# Patient Record
Sex: Male | Born: 1985
Health system: Southern US, Community
[De-identification: ages and names within clinical notes are randomized; demographics above are authoritative.]

## PROBLEM LIST (undated history)

## (undated) DIAGNOSIS — M549 Dorsalgia, unspecified: Secondary | ICD-10-CM

---
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
## 2006-08-08 ENCOUNTER — Emergency Department (HOSPITAL_COMMUNITY): Admission: EM | Admit: 2006-08-08 | Discharge: 2006-08-08 | Payer: Self-pay | Admitting: Emergency Medicine

## 2006-08-10 ENCOUNTER — Emergency Department (HOSPITAL_COMMUNITY): Admission: EM | Admit: 2006-08-10 | Discharge: 2006-08-10 | Payer: Self-pay | Admitting: Emergency Medicine

## 2006-08-13 ENCOUNTER — Emergency Department (HOSPITAL_COMMUNITY): Admission: EM | Admit: 2006-08-13 | Discharge: 2006-08-13 | Payer: Self-pay | Admitting: Emergency Medicine

## 2006-10-20 ENCOUNTER — Emergency Department (HOSPITAL_COMMUNITY): Admission: EM | Admit: 2006-10-20 | Discharge: 2006-10-20 | Payer: Self-pay | Admitting: Emergency Medicine

## 2009-02-15 ENCOUNTER — Emergency Department (HOSPITAL_COMMUNITY): Admission: EM | Admit: 2009-02-15 | Discharge: 2009-02-15 | Payer: Self-pay | Admitting: Emergency Medicine

## 2010-10-05 ENCOUNTER — Emergency Department (HOSPITAL_COMMUNITY)
Admission: EM | Admit: 2010-10-05 | Discharge: 2010-10-05 | Disposition: A | Discharge status: Home / Self Care | Payer: Self-pay | Attending: Emergency Medicine | Admitting: Emergency Medicine

## 2010-10-05 ENCOUNTER — Emergency Department (HOSPITAL_COMMUNITY): Payer: Self-pay

## 2010-10-05 DIAGNOSIS — W19XXXA Unspecified fall, initial encounter: Secondary | ICD-10-CM | POA: Insufficient documentation

## 2010-10-05 DIAGNOSIS — Y9367 Activity, basketball: Secondary | ICD-10-CM | POA: Insufficient documentation

## 2010-10-05 DIAGNOSIS — Y9239 Other specified sports and athletic area as the place of occurrence of the external cause: Secondary | ICD-10-CM | POA: Insufficient documentation

## 2010-10-05 DIAGNOSIS — S92213A Displaced fracture of cuboid bone of unspecified foot, initial encounter for closed fracture: Secondary | ICD-10-CM | POA: Insufficient documentation

## 2011-02-20 ENCOUNTER — Emergency Department (HOSPITAL_COMMUNITY)
Admission: EM | Admit: 2011-02-20 | Discharge: 2011-02-20 | Disposition: A | Discharge status: Home / Self Care | Payer: Self-pay | Attending: Emergency Medicine | Admitting: Emergency Medicine

## 2011-02-20 DIAGNOSIS — M549 Dorsalgia, unspecified: Secondary | ICD-10-CM | POA: Insufficient documentation

## 2011-02-26 ENCOUNTER — Emergency Department (HOSPITAL_COMMUNITY)
Admission: EM | Admit: 2011-02-26 | Discharge: 2011-02-26 | Disposition: A | Discharge status: Home / Self Care | Payer: Self-pay | Attending: Emergency Medicine | Admitting: Emergency Medicine

## 2011-02-26 DIAGNOSIS — S335XXA Sprain of ligaments of lumbar spine, initial encounter: Secondary | ICD-10-CM | POA: Insufficient documentation

## 2011-02-26 DIAGNOSIS — M545 Low back pain, unspecified: Secondary | ICD-10-CM | POA: Insufficient documentation

## 2011-02-26 DIAGNOSIS — X503XXA Overexertion from repetitive movements, initial encounter: Secondary | ICD-10-CM | POA: Insufficient documentation

## 2012-12-24 ENCOUNTER — Emergency Department (HOSPITAL_COMMUNITY)
Admission: EM | Admit: 2012-12-24 | Discharge: 2012-12-25 | Disposition: A | Discharge status: Home / Self Care | Payer: Self-pay | Attending: Emergency Medicine | Admitting: Emergency Medicine

## 2012-12-24 ENCOUNTER — Encounter (HOSPITAL_COMMUNITY): Payer: Self-pay | Admitting: *Deleted

## 2012-12-24 DIAGNOSIS — R059 Cough, unspecified: Secondary | ICD-10-CM | POA: Insufficient documentation

## 2012-12-24 DIAGNOSIS — R112 Nausea with vomiting, unspecified: Secondary | ICD-10-CM

## 2012-12-24 DIAGNOSIS — R1084 Generalized abdominal pain: Secondary | ICD-10-CM | POA: Insufficient documentation

## 2012-12-24 DIAGNOSIS — R000 Tachycardia, unspecified: Secondary | ICD-10-CM | POA: Insufficient documentation

## 2012-12-24 DIAGNOSIS — R05 Cough: Secondary | ICD-10-CM | POA: Insufficient documentation

## 2012-12-24 DIAGNOSIS — D72829 Elevated white blood cell count, unspecified: Secondary | ICD-10-CM | POA: Insufficient documentation

## 2012-12-24 DIAGNOSIS — R197 Diarrhea, unspecified: Secondary | ICD-10-CM | POA: Insufficient documentation

## 2012-12-24 DIAGNOSIS — J3489 Other specified disorders of nose and nasal sinuses: Secondary | ICD-10-CM | POA: Insufficient documentation

## 2012-12-24 DIAGNOSIS — J069 Acute upper respiratory infection, unspecified: Secondary | ICD-10-CM | POA: Insufficient documentation

## 2012-12-24 DIAGNOSIS — R51 Headache: Secondary | ICD-10-CM | POA: Insufficient documentation

## 2012-12-24 DIAGNOSIS — R0982 Postnasal drip: Secondary | ICD-10-CM | POA: Insufficient documentation

## 2012-12-24 DIAGNOSIS — R1310 Dysphagia, unspecified: Secondary | ICD-10-CM | POA: Insufficient documentation

## 2012-12-24 DIAGNOSIS — R0789 Other chest pain: Secondary | ICD-10-CM | POA: Insufficient documentation

## 2012-12-24 DIAGNOSIS — J029 Acute pharyngitis, unspecified: Secondary | ICD-10-CM | POA: Insufficient documentation

## 2012-12-24 DIAGNOSIS — R0602 Shortness of breath: Secondary | ICD-10-CM | POA: Insufficient documentation

## 2012-12-24 DIAGNOSIS — F17200 Nicotine dependence, unspecified, uncomplicated: Secondary | ICD-10-CM | POA: Insufficient documentation

## 2012-12-24 MED ORDER — ALBUTEROL SULFATE (5 MG/ML) 0.5% IN NEBU
5.0000 mg | INHALATION_SOLUTION | Freq: Once | RESPIRATORY_TRACT | Status: AC
  Administered 2012-12-25: 5 mg via RESPIRATORY_TRACT
  Filled 2012-12-24: qty 1

## 2012-12-24 MED ORDER — SODIUM CHLORIDE 0.9 % IV SOLN: 1000.0000 mL | INTRAVENOUS | Status: DC

## 2012-12-24 MED ORDER — DEXAMETHASONE SODIUM PHOSPHATE 10 MG/ML IJ SOLN
10.0000 mg | Freq: Once | INTRAMUSCULAR | Status: AC
  Administered 2012-12-25: 10 mg via INTRAVENOUS
  Filled 2012-12-24: qty 1

## 2012-12-24 MED ORDER — SODIUM CHLORIDE 0.9 % IV SOLN
1000.0000 mL | INTRAVENOUS | Status: DC
  Administered 2012-12-24: 1000 mL via INTRAVENOUS

## 2012-12-24 MED ORDER — OXYCODONE HCL 5 MG/5ML PO SOLN
5.0000 mg | Freq: Once | ORAL | Status: AC
  Administered 2012-12-25: 5 mg via ORAL
  Filled 2012-12-24: qty 5

## 2012-12-24 MED ORDER — SODIUM CHLORIDE 0.9 % IV SOLN
1000.0000 mL | Freq: Once | INTRAVENOUS | Status: AC
  Administered 2012-12-25: 1000 mL via INTRAVENOUS

## 2012-12-24 MED ORDER — IBUPROFEN 100 MG/5ML PO SUSP
600.0000 mg | Freq: Once | ORAL | Status: AC
  Administered 2012-12-25: 600 mg via ORAL
  Filled 2012-12-24: qty 30

## 2012-12-24 MED ORDER — SODIUM CHLORIDE 0.9 % IV SOLN
1000.0000 mL | Freq: Once | INTRAVENOUS | Status: AC
  Administered 2012-12-24: 1000 mL via INTRAVENOUS

## 2012-12-24 MED ORDER — ONDANSETRON HCL 4 MG/2ML IJ SOLN
4.0000 mg | Freq: Once | INTRAMUSCULAR | Status: AC
  Administered 2012-12-24: 4 mg via INTRAVENOUS
  Filled 2012-12-24: qty 2

## 2012-12-24 NOTE — ED Provider Notes (Signed)
CSN: [id number]     Arrival date & time 12/24/12  2316 History     First MD Initiated Contact with Patient 12/24/12 2324     Chief Complaint  Patient presents with  . Fever  . Nausea  . Emesis   (Consider location/radiation/quality/duration/timing/severity/associated sxs/prior Treatment) Patient is a 27 y.o. male presenting with fever and vomiting. The history is provided by the patient and medical records. No language interpreter was used.  Fever Associated symptoms: chills, congestion, cough, headaches, nausea, rhinorrhea, sore throat and vomiting   Associated symptoms: no chest pain, no confusion, no diarrhea, no dysuria, no ear pain, no myalgias and no rash   Emesis Associated symptoms: abdominal pain, chills, headaches and sore throat   Associated symptoms: no arthralgias, no diarrhea and no myalgias     HUSSIEN GREENBLATT is a 27 y.o. male  with no known medical history presents to the Emergency Department complaining of gradual, persistent, progressively worsening fever, chills, headache, sore throat, nasal congestion, postnasal drip and cough beginning last night. Patient states he took Advil without relief. Patient also had associated body aches. Symptoms persisted throughout the day and began to have nausea and vomiting approximately 8 PM tonight. Patient states that both episodes were posttussive. He also states that he's had mild, generalized abdominal cramping just prior to his nausea and vomiting. He endorses several episodes of loose stools.  He reports that his abdominal pain has eased off at this time but he continues to have a sore throat.  He endorses subjective fevers at home but has not measured them. Nothing makes it better and nothing makes it worse.  Pt denies neck pain, chest pain, weakness, dizziness, syncope, dysuria, hematuria.     History reviewed. No pertinent past medical history. History reviewed. No pertinent past surgical history. History reviewed. No  pertinent family history. History  Substance Use Topics  . Smoking status: Current Every Day Smoker    Types: Cigarettes  . Smokeless tobacco: Never Used  . Alcohol Use: Yes     Comment: on occasion    Review of Systems  Constitutional: Positive for fever and chills. Negative for diaphoresis, appetite change, fatigue and unexpected weight change.  HENT: Positive for congestion, sore throat, rhinorrhea, trouble swallowing (secondary to pain), postnasal drip and sinus pressure. Negative for ear pain, mouth sores, neck pain, neck stiffness and ear discharge.   Eyes: Negative for visual disturbance.  Respiratory: Positive for cough, chest tightness, shortness of breath and wheezing. Negative for stridor.   Cardiovascular: Negative for chest pain, palpitations and leg swelling.  Gastrointestinal: Positive for nausea, vomiting and abdominal pain. Negative for diarrhea, constipation, blood in stool, abdominal distention and rectal pain.  Genitourinary: Negative for dysuria, urgency, frequency, hematuria, flank pain and difficulty urinating.  Musculoskeletal: Negative for myalgias, back pain and arthralgias.  Skin: Negative for rash.  Neurological: Positive for headaches. Negative for syncope, weakness, light-headedness and numbness.  Hematological: Negative for adenopathy.  Psychiatric/Behavioral: Negative for confusion. The patient is not nervous/anxious.   All other systems reviewed and are negative.    Allergies  Review of patient's allergies indicates no known allergies.  Home Medications  No current outpatient prescriptions on file. BP 134/75  Pulse 96  Temp(Src) 99.2 F (37.3 C) (Oral)  Resp 18  Ht 6\' 4"  (1.93 m)  Wt 225 lb (102.059 kg)  BMI 27.4 kg/m2  SpO2 98% Physical Exam  Nursing note and vitals reviewed. Constitutional: He is oriented to person, place, and time. He appears  well-developed and well-nourished. No distress.  Awake, alert, nontoxic appearance  HENT:   Head: Normocephalic and atraumatic.  Right Ear: Tympanic membrane, external ear and ear canal normal.  Left Ear: Tympanic membrane, external ear and ear canal normal.  Nose: Mucosal edema and rhinorrhea present. No epistaxis. Right sinus exhibits no maxillary sinus tenderness and no frontal sinus tenderness. Left sinus exhibits no maxillary sinus tenderness and no frontal sinus tenderness.  Mouth/Throat: Uvula is midline. Mucous membranes are not pale, dry (Mildly) and not cyanotic. No edematous. Oropharyngeal exudate, posterior oropharyngeal edema and posterior oropharyngeal erythema present. No tonsillar abscesses.  Eyes: Conjunctivae and EOM are normal. Pupils are equal, round, and reactive to light. No scleral icterus.  Neck: Normal range of motion, full passive range of motion without pain and phonation normal. Neck supple. No spinous process tenderness and no muscular tenderness present. No rigidity. Normal range of motion present.  Patent airway, handling his secretions, no stridor No nuchal rigidity To manage this  Cardiovascular: Regular rhythm, S1 normal, S2 normal, normal heart sounds and intact distal pulses.  Tachycardia present.   Pulses:      Radial pulses are 2+ on the right side, and 2+ on the left side.       Dorsalis pedis pulses are 2+ on the right side, and 2+ on the left side.       Posterior tibial pulses are 2+ on the right side, and 2+ on the left side.  Pulmonary/Chest: Effort normal. No accessory muscle usage or stridor. Not tachypneic. No respiratory distress. He has decreased breath sounds ( throughout). He has wheezes ( throughout). He has rhonchi (throughout). He has no rales. He exhibits no tenderness and no bony tenderness.  Abdominal: Soft. Bowel sounds are normal. He exhibits no mass. There is no tenderness. There is no rebound and no guarding.  Musculoskeletal: Normal range of motion. He exhibits no edema.  Lymphadenopathy:       Head (right side):  Submandibular and tonsillar adenopathy present. No submental, no preauricular, no posterior auricular and no occipital adenopathy present.       Head (left side): Submandibular and tonsillar adenopathy present. No submental, no preauricular, no posterior auricular and no occipital adenopathy present.    He has no cervical adenopathy.       Right cervical: No superficial cervical, no deep cervical and no posterior cervical adenopathy present.      Left cervical: No superficial cervical, no deep cervical and no posterior cervical adenopathy present.  Neurological: He is alert and oriented to person, place, and time. He exhibits normal muscle tone. Coordination normal.  Speech is clear and goal oriented Moves extremities without ataxia  Skin: Skin is warm and dry. No rash noted. He is not diaphoretic.  No petechiae  Psychiatric: He has a normal mood and affect.    ED Course   Procedures (including critical care time)  Labs Reviewed  RAPID STREP SCREEN - Abnormal; Notable for the following:    Streptococcus, Group A Screen (Direct) POSITIVE (*)    All other components within normal limits  CBC WITH DIFFERENTIAL - Abnormal; Notable for the following:    WBC 28.5 (*)    MCHC 36.5 (*)    Neutrophils Relative % 82 (*)    Lymphocytes Relative 4 (*)    Monocytes Relative 14 (*)    Neutro Abs 23.4 (*)    Monocytes Absolute 4.0 (*)    All other components within normal limits  COMPREHENSIVE METABOLIC PANEL -  Abnormal; Notable for the following:    Sodium 130 (*)    Glucose, Bld 123 (*)    All other components within normal limits  URINALYSIS, ROUTINE W REFLEX MICROSCOPIC - Abnormal; Notable for the following:    Hgb urine dipstick SMALL (*)    All other components within normal limits  LIPASE, BLOOD  URINE MICROSCOPIC-ADD ON  RAPID HIV SCREEN ([facility])   Dg Chest 2 View  12/25/2012   *RADIOLOGY REPORT*  Clinical Data: Shortness of breath and weakness for 24 hours.  CHEST - 2 VIEW   Comparison: None.  Findings: Normal heart size and pulmonary vascularity.  No focal airspace consolidation in the lungs.  No blunting of costophrenic angles.  No pneumothorax.  Mediastinal contours appear intact.  IMPRESSION: No evidence of active pulmonary disease.   Original Report Authenticated By: Burman Nieves, M.D.   1. Pharyngitis   2. URI (upper respiratory infection)   3. Nausea vomiting and diarrhea   4. Leukocytosis     MDM  [name] presents with subjective fever, sore throat, cough and nausea vomiting diarrhea.  Patient meets 3 of the 4 Center criteria and likely has strep throat we'll assess. Patient with decreased breath sounds wheezing and rhonchi throughout. Will obtain chest x-ray and give albuterol treatment. Will also give fluid bolus and check basic labs.    12:49 AM Pt not orthostatic.  Rapid strep positive.  CMP and lipase WNL. UA without evidence of UTI.  CXR without evidence of pulmonary edema or pneumonia.  CBC with significant leukocytosis of 28.5.  Will order rapid HIV, abd CT scan and begin IV rocephin.  Repeat fluid bolus.  Pt tolerating PO fluids without difficulty.  I personally reviewed the imaging tests through PACS system.  I reviewed available ER/hospitalization records through the EMR.     1:17 AM Alert, nontoxic, nonseptic appearing. Tolerating by mouth here in the department without difficulty. Patient states he feels much better. Reevaluation of lung found.review of treatment reveals mild persistent wheezing in the right lower lobe but clear and equal breath sounds the rest of patient's lung fields. Pt VSS.    BP 134/75  Pulse 96  Temp(Src) 99.2 F (37.3 C) (Oral)  Resp 18  Ht 6\' 4"  (1.93 m)  Wt 225 lb (102.059 kg)  BMI 27.4 kg/m2  SpO2 98%   Dr. Dierdre Highman was consulted, evaluated this patient with me and agrees with the plan.  He will follow HIV and CT abd and disposition.      Dahlia Client Chance Munter, PA-C 12/25/12 0120

## 2012-12-24 NOTE — ED Notes (Signed)
Pt c/o fever, n/v, body aches. Pt states symptoms started last night. Pt states he took pcn last night and advi.

## 2012-12-25 ENCOUNTER — Emergency Department (HOSPITAL_COMMUNITY): Payer: Self-pay

## 2012-12-25 ENCOUNTER — Encounter (HOSPITAL_COMMUNITY): Payer: Self-pay

## 2012-12-25 LAB — COMPREHENSIVE METABOLIC PANEL
ALT: 18 U/L (ref 0–53)
Alkaline Phosphatase: 77 U/L (ref 39–117)
BUN: 8 mg/dL (ref 6–23)
CO2: 23 mEq/L (ref 19–32)
Chloride: 96 mEq/L (ref 96–112)
GFR calc Af Amer: >90 mL/min (ref >90)
Glucose, Bld: 123 mg/dL — ABNORMAL HIGH (ref 70–99)
Potassium: 3.6 mEq/L (ref 3.5–5.1)
Total Bilirubin: 0.7 mg/dL (ref 0.3–1.2)

## 2012-12-25 LAB — CBC WITH DIFFERENTIAL/PLATELET
Basophils Relative: 0 % (ref 0–1)
Eosinophils Relative: 0 % (ref 0–5)
HCT: 42.7 % (ref 39.0–52.0)
Hemoglobin: 15.6 g/dL (ref 13.0–17.0)
Lymphocytes Relative: 4 % — ABNORMAL LOW (ref 12–46)
MCHC: 36.5 g/dL — ABNORMAL HIGH (ref 30.0–36.0)
Monocytes Relative: 14 % — ABNORMAL HIGH (ref 3–12)
Neutro Abs: 23.4 10*3/uL — ABNORMAL HIGH (ref 1.7–7.7)
RBC: 4.94 MIL/uL (ref 4.22–5.81)
WBC: 28.5 10*3/uL — ABNORMAL HIGH (ref 4.0–10.5)

## 2012-12-25 LAB — URINALYSIS, ROUTINE W REFLEX MICROSCOPIC
Bilirubin Urine: NEGATIVE
Ketones, ur: NEGATIVE mg/dL
Nitrite: NEGATIVE
Urobilinogen, UA: 1 mg/dL (ref 0.0–1.0)
pH: 7 (ref 5.0–8.0)

## 2012-12-25 LAB — LIPASE, BLOOD: Lipase: 27 U/L (ref 11–59)

## 2012-12-25 LAB — URINE MICROSCOPIC-ADD ON

## 2012-12-25 MED ORDER — IOHEXOL 300 MG/ML  SOLN
100.0000 mL | Freq: Once | INTRAMUSCULAR | Status: AC | PRN
  Administered 2012-12-25: 100 mL via INTRAVENOUS

## 2012-12-25 MED ORDER — IOHEXOL 300 MG/ML  SOLN
50.0000 mL | Freq: Once | INTRAMUSCULAR | Status: AC | PRN
  Administered 2012-12-25: 50 mL via ORAL

## 2012-12-25 MED ORDER — AMOXICILLIN 500 MG PO CAPS: 500.0000 mg | ORAL_CAPSULE | Freq: Three times a day (TID) | ORAL | Status: DC

## 2012-12-25 MED ORDER — ONDANSETRON HCL 4 MG PO TABS: 4.0000 mg | ORAL_TABLET | Freq: Four times a day (QID) | ORAL | Status: DC

## 2012-12-25 MED ORDER — DEXTROSE 5 % IV SOLN
1.0000 g | Freq: Once | INTRAVENOUS | Status: AC
  Administered 2012-12-25: 1 g via INTRAVENOUS
  Filled 2012-12-25: qty 10

## 2012-12-25 NOTE — ED Provider Notes (Signed)
Medical screening examination/treatment/procedure(s) were conducted as a shared visit with non-physician practitioner(s) and myself.  I personally evaluated the patient during the encounter.  Fever and sore throat with very kissing exudative tonsils on exam.  CT results shared with PT and he states he is feeling much better and wants to go home. ABx provided, plan Rx ABx, f/u in ED. All d/c and f/u instructions verbalized as understood.    [facility] referral provided for any RUQ pain or GB symptoms - no RUQ pain or tenderness - no indication for [place] at this time.   Results for orders placed during the hospital encounter of 12/24/12  RAPID STREP SCREEN      Result Value Range   Streptococcus, Group A Screen (Direct) POSITIVE (*) NEGATIVE  CBC WITH DIFFERENTIAL      Result Value Range   WBC 28.5 (*) 4.0 - 10.5 K/uL   RBC 4.94  4.22 - 5.81 MIL/uL   Hemoglobin 15.6  13.0 - 17.0 g/dL   HCT 16.1  09.6 - 04.5 %   MCV 86.4  78.0 - 100.0 fL   MCH 31.6  26.0 - 34.0 pg   MCHC 36.5 (*) 30.0 - 36.0 g/dL   RDW 40.9  81.1 - 91.4 %   Platelets 226  150 - 400 K/uL   Neutrophils Relative % 82 (*) 43 - 77 %   Lymphocytes Relative 4 (*) 12 - 46 %   Monocytes Relative 14 (*) 3 - 12 %   Eosinophils Relative 0  0 - 5 %   Basophils Relative 0  0 - 1 %   Neutro Abs 23.4 (*) 1.7 - 7.7 K/uL   Lymphs Abs 1.1  0.7 - 4.0 K/uL   Monocytes Absolute 4.0 (*) 0.1 - 1.0 K/uL   Eosinophils Absolute 0.0  0.0 - 0.7 K/uL   Basophils Absolute 0.0  0.0 - 0.1 K/uL   Smear Review MORPHOLOGY UNREMARKABLE    COMPREHENSIVE METABOLIC PANEL      Result Value Range   Sodium 130 (*) 135 - 145 mEq/L   Potassium 3.6  3.5 - 5.1 mEq/L   Chloride 96  96 - 112 mEq/L   CO2 23  19 - 32 mEq/L   Glucose, Bld 123 (*) 70 - 99 mg/dL   BUN 8  6 - 23 mg/dL   Creatinine, Ser 7.82  0.50 - 1.35 mg/dL   Calcium 9.2  8.4 - 95.6 mg/dL   Total Protein 7.3  6.0 - 8.3 g/dL   Albumin 3.7  3.5 - 5.2 g/dL   AST 18  0 - 37 U/L   ALT 18  0 - 53 U/L   Alkaline Phosphatase 77  39 - 117 U/L   Total Bilirubin 0.7  0.3 - 1.2 mg/dL   GFR calc non Af Amer >90  >90 mL/min   GFR calc Af Amer >90  >90 mL/min  LIPASE, BLOOD      Result Value Range   Lipase 27  11 - 59 U/L  URINALYSIS, ROUTINE W REFLEX MICROSCOPIC      Result Value Range   Color, Urine YELLOW  YELLOW   APPearance CLEAR  CLEAR   Specific Gravity, Urine 1.006  1.005 - 1.030   pH 7.0  5.0 - 8.0   Glucose, UA NEGATIVE  NEGATIVE mg/dL   Hgb urine dipstick SMALL (*) NEGATIVE   Bilirubin Urine NEGATIVE  NEGATIVE   Ketones, ur NEGATIVE  NEGATIVE mg/dL   Protein, ur NEGATIVE  NEGATIVE  mg/dL   Urobilinogen, UA 1.0  0.0 - 1.0 mg/dL   Nitrite NEGATIVE  NEGATIVE   Leukocytes, UA NEGATIVE  NEGATIVE  URINE MICROSCOPIC-ADD ON      Result Value Range   Squamous Epithelial / LPF RARE  RARE   RBC / HPF 0-2  <3 RBC/hpf  RAPID HIV SCREEN ([facility])      Result Value Range   SUDS Rapid HIV Screen NON REACTIVE  NON REACTIVE   Dg Chest 2 View  12/25/2012   *RADIOLOGY REPORT*  Clinical Data: Shortness of breath and weakness for 24 hours.  CHEST - 2 VIEW  Comparison: None.  Findings: Normal heart size and pulmonary vascularity.  No focal airspace consolidation in the lungs.  No blunting of costophrenic angles.  No pneumothorax.  Mediastinal contours appear intact.  IMPRESSION: No evidence of active pulmonary disease.   Original Report Authenticated By: Burman Nieves, M.D.   Ct Abdomen Pelvis W Contrast  12/25/2012   *RADIOLOGY REPORT*  Clinical Data: Abdominal pain, nausea, vomiting, diarrhea, fever, and back pain.  White cell count 28.5.  Microhematuria.  CT ABDOMEN AND PELVIS WITH CONTRAST  Technique:  Multidetector CT imaging of the abdomen and pelvis was performed following the standard protocol during bolus administration of intravenous contrast.  Contrast: OMNIPAQUE IOHEXOL 300 MG/ML  SOLN  Comparison: 02/15/2009  Findings: The lung bases are clear.  There appears to be a small stone in  the gallbladder.  No inflammatory changes.  The liver, spleen, pancreas, adrenal glands, kidneys, abdominal aorta, inferior vena cava, and retroperitoneal lymph nodes are unremarkable.  The stomach is decompressed.  Small bowel are not distended.  Stool filled colon without distension. No free air or free fluid in the abdomen.  Abdominal wall musculature appears intact.  Pelvis:  Prostate gland is not enlarged.  Bladder wall is not thickened.  No free or loculated pelvic fluid collections. Appendix is normal.  No evidence of diverticulitis.  No significant pelvic lymphadenopathy.  The normal alignment of the lumbar vertebrae.  No destructive bone lesions appreciated.  IMPRESSION: No acute process demonstrated in the abdomen or pelvis.  Probable small gallstones.   Original Report Authenticated By: Burman Nieves, M.D.      Sunnie Nielsen, MD 12/25/12 [phone]

## 2013-08-09 ENCOUNTER — Encounter (HOSPITAL_COMMUNITY): Payer: Self-pay | Admitting: Emergency Medicine

## 2013-08-09 ENCOUNTER — Emergency Department (HOSPITAL_COMMUNITY)
Admission: EM | Admit: 2013-08-09 | Discharge: 2013-08-09 | Disposition: A | Discharge status: Home / Self Care | Payer: Self-pay | Attending: Emergency Medicine | Admitting: Emergency Medicine

## 2013-08-09 DIAGNOSIS — F17200 Nicotine dependence, unspecified, uncomplicated: Secondary | ICD-10-CM | POA: Insufficient documentation

## 2013-08-09 DIAGNOSIS — M545 Low back pain, unspecified: Secondary | ICD-10-CM | POA: Insufficient documentation

## 2013-08-09 DIAGNOSIS — M549 Dorsalgia, unspecified: Secondary | ICD-10-CM

## 2013-08-09 DIAGNOSIS — G8929 Other chronic pain: Secondary | ICD-10-CM | POA: Insufficient documentation

## 2013-08-09 DIAGNOSIS — R209 Unspecified disturbances of skin sensation: Secondary | ICD-10-CM | POA: Insufficient documentation

## 2013-08-09 HISTORY — DX: Dorsalgia, unspecified: M54.9

## 2013-08-09 MED ORDER — HYDROCODONE-ACETAMINOPHEN 5-325 MG PO TABS
1.0000 | ORAL_TABLET | Freq: Four times a day (QID) | ORAL | Status: DC | PRN
Start: 2013-08-09 — End: 2015-06-03

## 2013-08-09 MED ORDER — CYCLOBENZAPRINE HCL 5 MG PO TABS: 5.0000 mg | ORAL_TABLET | Freq: Two times a day (BID) | ORAL | Status: DC | PRN

## 2013-08-09 NOTE — ED Provider Notes (Signed)
CSN: [id number]     Arrival date & time 08/09/13  1922 History  This chart was scribed for Teressa LowerVrinda Zacharee Gaddie, NP, working with Junius ArgyleForrest S Harrison, MD, by Ardelia Memsylan Malpass ED Scribe. This patient was seen in room WTR8/WTR8 and the patient's care was started at 7:48 PM.   Chief Complaint  Patient presents with  . Back Pain    The history is provided by the patient. No language interpreter was used.    HPI Comments: Joseph Simon is a 28 y.o. male who presents to the Emergency Department complaining of constant, moderate lower back pain onset 3 days ago while playing basketball. He denies any falls or other trauma occuring. He reports an associated burning sensation in his hip as well as intermittent leg paresthesias. He states that his pain is worsened with positioning. He states that he has taken OTC medications with some relief. He reports having a back injury a couple of years ago while lifting, but states that his pain improved after taking muscle relaxants. He states that he has no chronic medical conditions. He denies weakness, bowel or bladder incontinence or any other pain or symptoms.    Past Medical History  Diagnosis Date  . Back pain    History reviewed. No pertinent past surgical history. No family history on file. History  Substance Use Topics  . Smoking status: Current Every Day Smoker    Types: Cigarettes  . Smokeless tobacco: Never Used  . Alcohol Use: Yes     Comment: on occasion    Review of Systems  Gastrointestinal:       Denies bowel incontinence  Genitourinary:       Denies bladder incontinence  Musculoskeletal: Positive for back pain.  Neurological: Negative for weakness.  All other systems reviewed and are negative.   Allergies  Review of patient's allergies indicates no known allergies.  Home Medications   Current Outpatient Rx  Name  Route  Sig  Dispense  Refill  . amoxicillin (AMOXIL) 500 MG capsule   Oral   Take 1 capsule (500 mg total) by  mouth 3 (three) times daily.   21 capsule   0   . ondansetron (ZOFRAN) 4 MG tablet   Oral   Take 1 tablet (4 mg total) by mouth every 6 (six) hours.   12 tablet   0    Triage Vitals: BP 143/67  Pulse 65  Temp(Src) 98.4 F (36.9 C) (Oral)  Resp 18  SpO2 100%  Physical Exam  Nursing note and vitals reviewed. Constitutional: He is oriented to person, place, and time. He appears well-developed and well-nourished. No distress.  HENT:  Head: Normocephalic and atraumatic.  Eyes: EOM are normal.  Neck: Neck supple. No tracheal deviation present.  Cardiovascular: Normal rate.   Pulmonary/Chest: Effort normal. No respiratory distress.  Musculoskeletal: Normal range of motion. He exhibits tenderness.  Lumbar tenderness. Moving all extremites well. Equal strength in extremities  Neurological: He is alert and oriented to person, place, and time.  Skin: Skin is warm and dry.  Psychiatric: He has a normal mood and affect. His behavior is normal.    ED Course  Procedures (including critical care time)  DIAGNOSTIC STUDIES: Oxygen Saturation is 100% on RA, normal by my interpretation.    COORDINATION OF CARE: 7:52 PM- Discussed plan to treat with muscle relaxants. Will also give pt a work note.Pt advised of plan for treatment and pt agrees.  Labs Review Labs Reviewed - No data to display Imaging  Review No results found.   EKG Interpretation None      MDM   Final diagnoses:  Back pain    Pt is neurologically intact. Pt not having any red flags. Will treat symptomatically and follow up as needed  I personally performed the services described in this documentation, which was scribed in my presence. The recorded information has been reviewed and is accurate.   Teressa Lower, NP 08/09/13 [phone]

## 2013-08-09 NOTE — ED Notes (Signed)
MD at bedside. 

## 2013-08-09 NOTE — Discharge Instructions (Signed)
Back Pain, Adult Low back pain is very common. About 1 in 5 people have back pain.The cause of low back pain is rarely dangerous. The pain often gets better over time.About half of people with a sudden onset of back pain feel better in just 2 weeks. About 8 in 10 people feel better by 6 weeks.  CAUSES Some common causes of back pain include:  Strain of the muscles or ligaments supporting the spine.  Wear and tear (degeneration) of the spinal discs.  Arthritis.  Direct injury to the back. DIAGNOSIS Most of the time, the direct cause of low back pain is not known.However, back pain can be treated effectively even when the exact cause of the pain is unknown.Answering your caregiver's questions about your overall health and symptoms is one of the most accurate ways to make sure the cause of your pain is not dangerous. If your caregiver needs more information, he or she may order lab work or imaging tests (X-rays or MRIs).However, even if imaging tests show changes in your back, this usually does not require surgery. HOME CARE INSTRUCTIONS For many people, back pain returns.Since low back pain is rarely dangerous, it is often a condition that people can learn to manageon their own.   Remain active. It is stressful on the back to sit or stand in one place. Do not sit, drive, or stand in one place for more than 30 minutes at a time. Take short walks on level surfaces as soon as pain allows.Try to increase the length of time you walk each day.  Do not stay in bed.Resting more than 1 or 2 days can delay your recovery.  Do not avoid exercise or work.Your body is made to move.It is not dangerous to be active, even though your back may hurt.Your back will likely heal faster if you return to being active before your pain is gone.  Pay attention to your body when you bend and lift. Many people have less discomfortwhen lifting if they bend their knees, keep the load close to their bodies,and  avoid twisting. Often, the most comfortable positions are those that put less stress on your recovering back.  Find a comfortable position to sleep. Use a firm mattress and lie on your side with your knees slightly bent. If you lie on your back, put a pillow under your knees.  Only take over-the-counter or prescription medicines as directed by your caregiver. Over-the-counter medicines to reduce pain and inflammation are often the most helpful.Your caregiver may prescribe muscle relaxant drugs.These medicines help dull your pain so you can more quickly return to your normal activities and healthy exercise.  Put ice on the injured area.  Put ice in a plastic bag.  Place a towel between your skin and the bag.  Leave the ice on for 15-20 minutes, 03-04 times a day for the first 2 to 3 days. After that, ice and heat may be alternated to reduce pain and spasms.  Ask your caregiver about trying back exercises and gentle massage. This may be of some benefit.  Avoid feeling anxious or stressed.Stress increases muscle tension and can worsen back pain.It is important to recognize when you are anxious or stressed and learn ways to manage it.Exercise is a great option. SEEK MEDICAL CARE IF:  You have pain that is not relieved with rest or medicine.  You have pain that does not improve in 1 week.  You have new symptoms.  You are generally not feeling well. SEEK   IMMEDIATE MEDICAL CARE IF:   You have pain that radiates from your back into your legs.  You develop new bowel or bladder control problems.  You have unusual weakness or numbness in your arms or legs.  You develop nausea or vomiting.  You develop abdominal pain.  You feel faint. Document Released: 04/23/2005 Document Revised: 10/23/2011 Document Reviewed: 09/11/2010 [employer] Patient Information 2014 [employer].  

## 2013-08-09 NOTE — ED Notes (Addendum)
Pt presents with NAD. Chronic back pain- Pt reports playing basketball 3 days ago and has a flare up. Denies numbness and tingling. Pt reports OTC meds with some relief

## 2013-08-10 NOTE — ED Provider Notes (Signed)
Medical screening examination/treatment/procedure(s) were performed by non-physician practitioner and as supervising physician I was immediately available for consultation/collaboration.   EKG Interpretation None        Junius ArgyleForrest S Markeisha Mancias, MD 08/10/13 1134

## 2013-09-04 ENCOUNTER — Encounter (HOSPITAL_BASED_OUTPATIENT_CLINIC_OR_DEPARTMENT_OTHER): Payer: Self-pay | Admitting: Emergency Medicine

## 2013-09-04 ENCOUNTER — Emergency Department (HOSPITAL_BASED_OUTPATIENT_CLINIC_OR_DEPARTMENT_OTHER): Payer: Self-pay

## 2013-09-04 ENCOUNTER — Emergency Department (HOSPITAL_BASED_OUTPATIENT_CLINIC_OR_DEPARTMENT_OTHER)
Admission: EM | Admit: 2013-09-04 | Discharge: 2013-09-04 | Disposition: A | Discharge status: Home / Self Care | Payer: Self-pay | Attending: Emergency Medicine | Admitting: Emergency Medicine

## 2013-09-04 DIAGNOSIS — M545 Low back pain, unspecified: Secondary | ICD-10-CM | POA: Insufficient documentation

## 2013-09-04 DIAGNOSIS — F17200 Nicotine dependence, unspecified, uncomplicated: Secondary | ICD-10-CM | POA: Insufficient documentation

## 2013-09-04 DIAGNOSIS — M549 Dorsalgia, unspecified: Secondary | ICD-10-CM

## 2013-09-04 MED ORDER — PREDNISONE 10 MG PO TABS: ORAL_TABLET | ORAL | Status: DC

## 2013-09-04 MED ORDER — TRAMADOL HCL 50 MG PO TABS
50.0000 mg | ORAL_TABLET | Freq: Four times a day (QID) | ORAL | Status: DC | PRN
Start: 2013-09-04 — End: 2015-04-01

## 2013-09-04 NOTE — ED Provider Notes (Signed)
Medical screening examination/treatment/procedure(s) were performed by non-physician practitioner and as supervising physician I was immediately available for consultation/collaboration.   EKG Interpretation None        David H Yao, MD 09/04/13 2123 

## 2013-09-04 NOTE — ED Provider Notes (Signed)
CSN: [id number]     Arrival date & time 09/04/13  1506 History   First MD Initiated Contact with Patient 09/04/13 1516     Chief Complaint  Patient presents with  . Back Pain     (Consider location/radiation/quality/duration/timing/severity/associated sxs/prior Treatment) HPI Comments: Pt states that he has been having intermittent lower back pain for the last 6 months. Denies numbness, weakness, or incontinence. Pt states that medications help but only short term. Denies any known falls or injuries. Has some radiation to right leg.  The history is provided by the patient. No language interpreter was used.    Past Medical History  Diagnosis Date  . Back pain    History reviewed. No pertinent past surgical history. No family history on file. History  Substance Use Topics  . Smoking status: Current Every Day Smoker    Types: Cigarettes  . Smokeless tobacco: Never Used  . Alcohol Use: Yes     Comment: on occasion    Review of Systems  Constitutional: Negative.   Respiratory: Negative.   Cardiovascular: Negative.       Allergies  Review of patient's allergies indicates no known allergies.  Home Medications   Prior to Admission medications   Medication Sig Start Date End Date Taking? Authorizing Provider  acetaminophen (TYLENOL) 500 MG tablet Take 500 mg by mouth every 6 (six) hours as needed for mild pain.    Historical Provider, MD  cyclobenzaprine (FLEXERIL) 5 MG tablet Take 1 tablet (5 mg total) by mouth 2 (two) times daily as needed for muscle spasms. 08/09/13   Teressa LowerVrinda Kellyn Mccary, NP  HYDROcodone-acetaminophen (NORCO/VICODIN) 5-325 MG per tablet Take 1-2 tablets by mouth every 6 (six) hours as needed. 08/09/13   Teressa LowerVrinda Carrina Schoenberger, NP  ibuprofen (ADVIL,MOTRIN) 200 MG tablet Take 200 mg by mouth every 6 (six) hours as needed for mild pain.    Historical Provider, MD   BP 160/98  Pulse 79  Temp(Src) 98.3 F (36.8 C) (Oral)  Resp 18  Ht 6\' 4"  (1.93 m)  Wt 225 lb (102.059  kg)  BMI 27.40 kg/m2  SpO2 100% Physical Exam  Nursing note and vitals reviewed. Constitutional: He is oriented to person, place, and time. He appears well-developed and well-nourished.  Cardiovascular: Normal rate and regular rhythm.   Pulmonary/Chest: Effort normal and breath sounds normal.  Musculoskeletal: Normal range of motion.  Lower lumbar tenderness with palpation  Neurological: He is alert and oriented to person, place, and time. Coordination normal.  Skin: Skin is warm and dry.  Psychiatric: He has a normal mood and affect.    ED Course  Procedures (including critical care time) Labs Review Labs Reviewed - No data to display  Imaging Review Dg Lumbar Spine Complete  09/04/2013   CLINICAL DATA:  Back pain.  EXAM: LUMBAR SPINE - COMPLETE 4+ VIEW  COMPARISON:  None.  FINDINGS: There is no evidence of lumbar spine fracture. Alignment is normal. Intervertebral disc spaces are maintained.  IMPRESSION: Negative.   Electronically Signed   By: Maisie Fushomas  Register   On: 09/04/2013 16:14     EKG Interpretation None      MDM   Final diagnoses:  Back pain    Has no red flags. No bony abnormalities or lesions noted. Will treat with prednisone and tramadol and have follow up with Dr. Pearletha Forgehudnall for continued symptoms   Teressa LowerVrinda Meylin Stenzel, NP 09/04/13 1623

## 2013-09-04 NOTE — ED Notes (Signed)
Pt c/o lower back pain that has been intermittent for 6 months but became constant and more painful x2 days. Pt reports Advil helps but pain returns when med wears off.

## 2013-09-04 NOTE — Discharge Instructions (Signed)
Follow up as discussed for continued or worsening symptoms. Your x-ray was unremarkable today. Back Pain, Adult Low back pain is very common. About 1 in 5 people have back pain.The cause of low back pain is rarely dangerous. The pain often gets better over time.About half of people with a sudden onset of back pain feel better in just 2 weeks. About 8 in 10 people feel better by 6 weeks.  CAUSES Some common causes of back pain include:  Strain of the muscles or ligaments supporting the spine.  Wear and tear (degeneration) of the spinal discs.  Arthritis.  Direct injury to the back. DIAGNOSIS Most of the time, the direct cause of low back pain is not known.However, back pain can be treated effectively even when the exact cause of the pain is unknown.Answering your caregiver's questions about your overall health and symptoms is one of the most accurate ways to make sure the cause of your pain is not dangerous. If your caregiver needs more information, he or she may order lab work or imaging tests (X-rays or MRIs).However, even if imaging tests show changes in your back, this usually does not require surgery. HOME CARE INSTRUCTIONS For many people, back pain returns.Since low back pain is rarely dangerous, it is often a condition that people can learn to [facility] their own.   Remain active. It is stressful on the back to sit or stand in one place. Do not sit, drive, or stand in one place for more than 30 minutes at a time. Take short walks on level surfaces as soon as pain allows.Try to increase the length of time you walk each day.  Do not stay in bed.Resting more than 1 or 2 days can delay your recovery.  Do not avoid exercise or work.Your body is made to move.It is not dangerous to be active, even though your back may hurt.Your back will likely heal faster if you return to being active before your pain is gone.  Pay attention to your body when you bend and lift. Many people have  less discomfortwhen lifting if they bend their knees, keep the load close to their bodies,and avoid twisting. Often, the most comfortable positions are those that put less stress on your recovering back.  Find a comfortable position to sleep. Use a firm mattress and lie on your side with your knees slightly bent. If you lie on your back, put a pillow under your knees.  Only take over-the-counter or prescription medicines as directed by your caregiver. Over-the-counter medicines to reduce pain and inflammation are often the most helpful.Your caregiver may prescribe muscle relaxant drugs.These medicines help dull your pain so you can more quickly return to your normal activities and healthy exercise.  Put ice on the injured area.  Put ice in a plastic bag.  Place a towel between your skin and the bag.  Leave the ice on for 15-20 minutes, 03-04 times a day for the first 2 to 3 days. After that, ice and heat may be alternated to reduce pain and spasms.  Ask your caregiver about trying back exercises and gentle massage. This may be of some benefit.  Avoid feeling anxious or stressed.Stress increases muscle tension and can worsen back pain.It is important to recognize when you are anxious or stressed and learn ways to manage it.Exercise is a great option. SEEK MEDICAL CARE IF:  You have pain that is not relieved with rest or medicine.  You have pain that does not improve in 1  week.  You have new symptoms.  You are generally not feeling well. SEEK IMMEDIATE MEDICAL CARE IF:   You have pain that radiates from your back into your legs.  You develop new bowel or bladder control problems.  You have unusual weakness or numbness in your arms or legs.  You develop nausea or vomiting.  You develop abdominal pain.  You feel faint. Document Released: 04/23/2005 Document Revised: 10/23/2011 Document Reviewed: 09/11/2010 [facility] Patient Information 2014 [place], [place].

## 2013-09-07 ENCOUNTER — Encounter (HOSPITAL_BASED_OUTPATIENT_CLINIC_OR_DEPARTMENT_OTHER): Payer: Self-pay | Admitting: Emergency Medicine

## 2013-09-07 ENCOUNTER — Emergency Department (HOSPITAL_BASED_OUTPATIENT_CLINIC_OR_DEPARTMENT_OTHER)
Admission: EM | Admit: 2013-09-07 | Discharge: 2013-09-07 | Disposition: A | Discharge status: Home / Self Care | Payer: Self-pay | Attending: Emergency Medicine | Admitting: Emergency Medicine

## 2013-09-07 DIAGNOSIS — M545 Low back pain, unspecified: Secondary | ICD-10-CM | POA: Insufficient documentation

## 2013-09-07 DIAGNOSIS — M549 Dorsalgia, unspecified: Secondary | ICD-10-CM

## 2013-09-07 DIAGNOSIS — F17200 Nicotine dependence, unspecified, uncomplicated: Secondary | ICD-10-CM | POA: Insufficient documentation

## 2013-09-07 MED ORDER — CYCLOBENZAPRINE HCL 5 MG PO TABS: 5.0000 mg | ORAL_TABLET | Freq: Two times a day (BID) | ORAL | Status: DC | PRN

## 2013-09-07 MED ORDER — KETOROLAC TROMETHAMINE 30 MG/ML IJ SOLN
60.0000 mg | Freq: Once | INTRAMUSCULAR | Status: AC
  Administered 2013-09-07: 60 mg via INTRAMUSCULAR
  Filled 2013-09-07: qty 2

## 2013-09-07 NOTE — Discharge Instructions (Signed)
Back Pain, Adult  Back pain is very common. The pain often gets better over time. The cause of back pain is usually not dangerous. Most people can learn to manage their back pain on their own.   HOME CARE   · Stay active. Start with short walks on flat ground if you can. Try to walk farther each day.  · Do not sit, drive, or stand in one place for more than 30 minutes. Do not stay in bed.  · Do not avoid exercise or work. Activity can help your back heal faster.  · Be careful when you bend or lift an object. Bend at your knees, keep the object close to you, and do not twist.  · Sleep on a firm mattress. Lie on your side, and bend your knees. If you lie on your back, put a pillow under your knees.  · Only take medicines as told by your doctor.  · Put ice on the injured area.  · Put ice in a plastic bag.  · Place a towel between your skin and the bag.  · Leave the ice on for 15-20 minutes, 03-04 times a day for the first 2 to 3 days. After that, you can switch between ice and heat packs.  · Ask your doctor about back exercises or massage.  · Avoid feeling anxious or stressed. Find good ways to deal with stress, such as exercise.  GET HELP RIGHT AWAY IF:   · Your pain does not go away with rest or medicine.  · Your pain does not go away in 1 week.  · You have new problems.  · You do not feel well.  · The pain spreads into your legs.  · You cannot control when you poop (bowel movement) or pee (urinate).  · Your arms or legs feel weak or lose feeling (numbness).  · You feel sick to your stomach (nauseous) or throw up (vomit).  · You have belly (abdominal) pain.  · You feel like you may pass out (faint).  MAKE SURE YOU:   · Understand these instructions.  · Will watch your condition.  · Will get help right away if you are not doing well or get worse.  Document Released: 10/10/2007 Document Revised: 07/16/2011 Document Reviewed: 09/11/2010  [employer]® Patient Information ©2014 [employer].

## 2013-09-07 NOTE — ED Notes (Signed)
Additional pillow provided for comfort per request.

## 2013-09-07 NOTE — ED Provider Notes (Signed)
CSN: [id number]     Arrival date & time 09/07/13  1204 History   First MD Initiated Contact with Patient 09/07/13 1207     Chief Complaint  Patient presents with  . Back Pain     (Consider location/radiation/quality/duration/timing/severity/associated sxs/prior Treatment) HPI Comments: Pt states that he has been having pain for the last six months and it seems to progressively getting worse. Pt denies numbness, weakness or incontinence. Pt states that he has not had injury to the area. Has tried prescription medications which help intermittently but the pain is radiating down his legs bilaterally. Pt states that he was seen in the er 3 days ago and given prednisone and ultram without relief. Pt states that he can't see a specialist because he doesn't have insurance and can't afford it  The history is provided by the patient. No language interpreter was used.    Past Medical History  Diagnosis Date  . Back pain    History reviewed. No pertinent past surgical history. No family history on file. History  Substance Use Topics  . Smoking status: Current Every Day Smoker    Types: Cigarettes  . Smokeless tobacco: Never Used  . Alcohol Use: Yes     Comment: on occasion    Review of Systems  Constitutional: Negative.  Negative for fever.  Respiratory: Negative.   Cardiovascular: Negative.       Allergies  Review of patient's allergies indicates no known allergies.  Home Medications   Prior to Admission medications   Medication Sig Start Date End Date Taking? Authorizing Provider  acetaminophen (TYLENOL) 500 MG tablet Take 500 mg by mouth every 6 (six) hours as needed for mild pain.    Historical Provider, MD  cyclobenzaprine (FLEXERIL) 5 MG tablet Take 1 tablet (5 mg total) by mouth 2 (two) times daily as needed for muscle spasms. 08/09/13   Teressa LowerVrinda Deondra Wigger, NP  HYDROcodone-acetaminophen (NORCO/VICODIN) 5-325 MG per tablet Take 1-2 tablets by mouth every 6 (six) hours as needed.  08/09/13   Teressa LowerVrinda Bertis Hustead, NP  ibuprofen (ADVIL,MOTRIN) 200 MG tablet Take 200 mg by mouth every 6 (six) hours as needed for mild pain.    Historical Provider, MD  predniSONE (DELTASONE) 10 MG tablet 6 day step down dose 09/04/13   Teressa LowerVrinda Denny Lave, NP  traMADol (ULTRAM) 50 MG tablet Take 1 tablet (50 mg total) by mouth every 6 (six) hours as needed. 09/04/13   Teressa LowerVrinda Deania Siguenza, NP   BP 163/87  Pulse 77  Temp(Src) 98.6 F (37 C)  Resp 20  Ht 6\' 5"  (1.956 m)  Wt 230 lb (104.327 kg)  BMI 27.27 kg/m2  SpO2 100% Physical Exam  Nursing note and vitals reviewed. Constitutional: He is oriented to person, place, and time. He appears well-developed and well-nourished.  Cardiovascular: Normal rate and regular rhythm.   Pulmonary/Chest: Effort normal and breath sounds normal.  Abdominal: Bowel sounds are normal.  Musculoskeletal: Normal range of motion.  Lumbar spine and paraspinal tenderness. Pt is moving all extremities without any problem  Neurological: He is alert and oriented to person, place, and time. Coordination normal.  Skin: Skin is warm and dry.  Psychiatric: He has a normal mood and affect.    ED Course  Procedures (including critical care time) Labs Review Labs Reviewed - No data to display  Imaging Review No results found.   EKG Interpretation None      MDM   Final diagnoses:  Back pain    Pt not having any deficits.  No fever. No history of drug use. No red flags.     Teressa LowerVrinda Rose Hegner, NP 09/07/13 1708

## 2013-09-07 NOTE — ED Notes (Signed)
Pt reports lower lumbar pain radiating down to bilateral legs, denies uncontrolled loss of bowel or bladder, tingling to legs reported.

## 2013-09-08 NOTE — ED Provider Notes (Signed)
Medical screening examination/treatment/procedure(s) were performed by non-physician practitioner and as supervising physician I was immediately available for consultation/collaboration.     Cully Luckow, MD 09/08/13 0739 

## 2014-08-10 ENCOUNTER — Encounter (HOSPITAL_BASED_OUTPATIENT_CLINIC_OR_DEPARTMENT_OTHER): Payer: Self-pay | Admitting: Emergency Medicine

## 2014-08-10 ENCOUNTER — Emergency Department (HOSPITAL_BASED_OUTPATIENT_CLINIC_OR_DEPARTMENT_OTHER): Payer: Self-pay

## 2014-08-10 ENCOUNTER — Emergency Department (HOSPITAL_BASED_OUTPATIENT_CLINIC_OR_DEPARTMENT_OTHER)
Admission: EM | Admit: 2014-08-10 | Discharge: 2014-08-10 | Disposition: A | Discharge status: Home / Self Care | Payer: Self-pay | Attending: Emergency Medicine | Admitting: Emergency Medicine

## 2014-08-10 DIAGNOSIS — Z7982 Long term (current) use of aspirin: Secondary | ICD-10-CM | POA: Insufficient documentation

## 2014-08-10 DIAGNOSIS — Z79899 Other long term (current) drug therapy: Secondary | ICD-10-CM | POA: Insufficient documentation

## 2014-08-10 DIAGNOSIS — Z720 Tobacco use: Secondary | ICD-10-CM | POA: Insufficient documentation

## 2014-08-10 DIAGNOSIS — J069 Acute upper respiratory infection, unspecified: Secondary | ICD-10-CM | POA: Insufficient documentation

## 2014-08-10 MED ORDER — GUAIFENESIN 100 MG/5ML PO LIQD: 100.0000 mg | ORAL | Status: DC | PRN

## 2014-08-10 NOTE — ED Provider Notes (Signed)
CSN: [id number]     Arrival date & time 08/10/14  1906 History   First MD Initiated Contact with Patient 08/10/14 2022     Chief Complaint  Patient presents with  . Back Pain  . Chills  . Cough     (Consider location/radiation/quality/duration/timing/severity/associated sxs/prior Treatment) HPI   29 year old male who presents with URI symptoms. For the past 2 days patient has had a constellation of symptoms including headache, nasal congestion, sneezing, coughing, sore throat, runny nose, sinus pain. Yesterday he endorsed chills and tactile fever. Denies any nausea vomiting but endorsed some mild loose stools. No specific treatment tried. Furthermore patient mentioned that he would like to be evaluated for his gallstones. States that he has low back pain and has had x-ray of his low back from previous ER visit and was told that he has gallstones. He denies having any right upper quadrant abdominal pain, denies postprandial pain, nausea or vomiting. He is not a diabetic and denies any history of alcohol abuse. Last normal bowel movement aside from some loose stools. He has no other complaint.    Past Medical History  Diagnosis Date  . Back pain    History reviewed. No pertinent past surgical history. History reviewed. No pertinent family history. History  Substance Use Topics  . Smoking status: Current Every Day Smoker    Types: Cigarettes  . Smokeless tobacco: Never Used  . Alcohol Use: Yes     Comment: on occasion    Review of Systems  All other systems reviewed and are negative.     Allergies  Review of patient's allergies indicates no known allergies.  Home Medications   Prior to Admission medications   Medication Sig Start Date End Date Taking? Authorizing Provider  acetaminophen (TYLENOL) 500 MG tablet Take 500 mg by mouth every 6 (six) hours as needed for mild pain.    Historical Provider, MD  cyclobenzaprine (FLEXERIL) 5 MG tablet Take 1 tablet (5 mg total) by  mouth 2 (two) times daily as needed for muscle spasms. 08/09/13   Teressa LowerVrinda Pickering, NP  cyclobenzaprine (FLEXERIL) 5 MG tablet Take 1 tablet (5 mg total) by mouth 2 (two) times daily as needed for muscle spasms. 09/07/13   Teressa LowerVrinda Pickering, NP  HYDROcodone-acetaminophen (NORCO/VICODIN) 5-325 MG per tablet Take 1-2 tablets by mouth every 6 (six) hours as needed. 08/09/13   Teressa LowerVrinda Pickering, NP  ibuprofen (ADVIL,MOTRIN) 200 MG tablet Take 200 mg by mouth every 6 (six) hours as needed for mild pain.    Historical Provider, MD  predniSONE (DELTASONE) 10 MG tablet 6 day step down dose 09/04/13   Teressa LowerVrinda Pickering, NP  traMADol (ULTRAM) 50 MG tablet Take 1 tablet (50 mg total) by mouth every 6 (six) hours as needed. 09/04/13   Teressa LowerVrinda Pickering, NP   BP 154/95 mmHg  Pulse 70  Temp(Src) 98.8 F (37.1 C) (Oral)  Resp 20  Ht 6\' 4"  (1.93 m)  Wt 225 lb (102.059 kg)  BMI 27.40 kg/m2  SpO2 99% Physical Exam  Constitutional: He is oriented to person, place, and time. He appears well-developed and well-nourished. No distress.  HENT:  Head: Atraumatic.  Right Ear: External ear normal.  Left Ear: External ear normal.  Nose: Nose normal.  Mouth/Throat: Oropharynx is clear and moist. No oropharyngeal exudate.  Eyes: Conjunctivae are normal.  Neck: Normal range of motion. Neck supple.  No nuchal rigidity  Cardiovascular: Normal rate and regular rhythm.   Pulmonary/Chest: Effort normal and breath sounds normal.  Abdominal:  Soft. There is no tenderness.  Musculoskeletal: He exhibits no edema.  Neurological: He is alert and oriented to person, place, and time.  Skin: No rash noted.  Psychiatric: He has a normal mood and affect.  Nursing note and vitals reviewed.   ED Course  Procedures (including critical care time)  Patient here with URI symptoms. Chest x-ray shows no signs of pneumonia. Reassurance given. Patient also reports that he has low back pain that is persistent and the last time he was here he had a  x-ray of his back that indicates "gallstone" so therefore he request for further management of his gallstones. Patient however does not have a right upper quadrant abdominal tenderness, no symptoms to suggest gallstones. I reassured patient that it is unlikely that he has gallstones. Recommend symptomatically treatment and return precaution given. Patient voiced understanding and agrees with plan.  Labs Review Labs Reviewed - No data to display  Imaging Review Dg Chest 2 View  08/10/2014   CLINICAL DATA:  Three-day history of cough and chills  EXAM: CHEST  2 VIEW  COMPARISON:  December 25, 2012  FINDINGS: Lungs are clear. Heart size and pulmonary vascularity are normal. No adenopathy. No bone lesions.  IMPRESSION: No edema or consolidation.   Electronically Signed   By: Bretta Bang III M.D.   On: 08/10/2014 20:50     EKG Interpretation None      MDM   Final diagnoses:  URI (upper respiratory infection)    BP 157/80 mmHg  Pulse 64  Temp(Src) 98.8 F (37.1 C) (Oral)  Resp 16  Ht  (1.93 m)  Wt 225 lb (102.059 kg)  BMI 27.40 kg/m2  SpO2 100%     Fayrene Helper, PA-C 08/10/14 2250  Rolan Bucco, MD 08/10/14 2353

## 2014-08-10 NOTE — ED Notes (Signed)
Patient states that he is having cough and cold type symptoms for 2 days. Also states that he is having lower "back pain, not sure if it is due to the gallstone that I have" then points to his left hip region

## 2014-08-10 NOTE — Discharge Instructions (Signed)
Upper Respiratory Infection, Adult An upper respiratory infection (URI) is also sometimes known as the common cold. The upper respiratory tract includes the nose, sinuses, throat, trachea, and bronchi. Bronchi are the airways leading to the lungs. Most people improve within 1 week, but symptoms can last up to 2 weeks. A residual cough may last even longer.  CAUSES Many different viruses can infect the tissues lining the upper respiratory tract. The tissues become irritated and inflamed and often become very moist. Mucus production is also common. A cold is contagious. You can easily spread the virus to others by oral contact. This includes kissing, sharing a glass, coughing, or sneezing. Touching your mouth or nose and then touching a surface, which is then touched by another person, can also spread the virus. SYMPTOMS  Symptoms typically develop 1 to 3 days after you come in contact with a cold virus. Symptoms vary from person to person. They may include:  Runny nose.  Sneezing.  Nasal congestion.  Sinus irritation.  Sore throat.  Loss of voice (laryngitis).  Cough.  Fatigue.  Muscle aches.  Loss of appetite.  Headache.  Low-grade fever. DIAGNOSIS  You might diagnose your own cold based on familiar symptoms, since most people get a cold 2 to 3 times a year. Your caregiver can confirm this based on your exam. Most importantly, your caregiver can check that your symptoms are not due to another disease such as strep throat, sinusitis, pneumonia, asthma, or epiglottitis. Blood tests, throat tests, and X-rays are not necessary to diagnose a common cold, but they may sometimes be helpful in excluding other more serious diseases. Your caregiver will decide if any further tests are required. RISKS AND COMPLICATIONS  You may be at risk for a more severe case of the common cold if you smoke cigarettes, have chronic heart disease (such as heart failure) or lung disease (such as asthma), or if  you have a weakened immune system. The very young and very old are also at risk for more serious infections. Bacterial sinusitis, middle ear infections, and bacterial pneumonia can complicate the common cold. The common cold can worsen asthma and chronic obstructive pulmonary disease (COPD). Sometimes, these complications can require emergency medical care and may be life-threatening. PREVENTION  The best way to protect against getting a cold is to practice good hygiene. Avoid oral or hand contact with people with cold symptoms. Wash your hands often if contact occurs. There is no clear evidence that vitamin C, vitamin E, echinacea, or exercise reduces the chance of developing a cold. However, it is always recommended to get plenty of rest and practice good nutrition. TREATMENT  Treatment is directed at relieving symptoms. There is no cure. Antibiotics are not effective, because the infection is caused by a virus, not by bacteria. Treatment may include:  Increased fluid intake. Sports drinks offer valuable electrolytes, sugars, and fluids.  Breathing heated mist or steam (vaporizer or shower).  Eating chicken soup or other clear broths, and maintaining good nutrition.  Getting plenty of rest.  Using gargles or lozenges for comfort.  Controlling fevers with ibuprofen or acetaminophen as directed by your caregiver.  Increasing usage of your inhaler if you have asthma. Zinc gel and zinc lozenges, taken in the first 24 hours of the common cold, can shorten the duration and lessen the severity of symptoms. Pain medicines may help with fever, muscle aches, and throat pain. A variety of non-prescription medicines are available to treat congestion and runny nose. Your caregiver   can make recommendations and may suggest nasal or lung inhalers for other symptoms.  HOME CARE INSTRUCTIONS   Only take over-the-counter or prescription medicines for pain, discomfort, or fever as directed by your  caregiver.  Use a warm mist humidifier or inhale steam from a shower to increase air moisture. This may keep secretions moist and make it easier to breathe.  Drink enough water and fluids to keep your urine clear or pale yellow.  Rest as needed.  Return to work when your temperature has returned to normal or as your caregiver advises. You may need to stay home longer to avoid infecting others. You can also use a face mask and careful hand washing to prevent spread of the virus. SEEK MEDICAL CARE IF:   After the first few days, you feel you are getting worse rather than better.  You need your caregiver's advice about medicines to control symptoms.  You develop chills, worsening shortness of breath, or brown or red sputum. These may be signs of pneumonia.  You develop yellow or brown nasal discharge or pain in the face, especially when you bend forward. These may be signs of sinusitis.  You develop a fever, swollen neck glands, pain with swallowing, or white areas in the back of your throat. These may be signs of strep throat. SEEK IMMEDIATE MEDICAL CARE IF:   You have a fever.  You develop severe or persistent headache, ear pain, sinus pain, or chest pain.  You develop wheezing, a prolonged cough, cough up blood, or have a change in your usual mucus (if you have chronic lung disease).  You develop sore muscles or a stiff neck. Document Released: 10/17/2000 Document Revised: 07/16/2011 Document Reviewed: 07/29/2013 [employer] Patient Information 2015 [employer]. This information is not intended to replace advice given to you by your health care provider. Make sure you discuss any questions you have with your health care provider.  

## 2015-04-01 ENCOUNTER — Emergency Department (INDEPENDENT_AMBULATORY_CARE_PROVIDER_SITE_OTHER)
Admission: EM | Admit: 2015-04-01 | Discharge: 2015-04-01 | Disposition: A | Discharge status: Home / Self Care | Payer: Self-pay | Source: Home / Self Care | Attending: Family Medicine | Admitting: Family Medicine

## 2015-04-01 ENCOUNTER — Encounter (HOSPITAL_COMMUNITY): Payer: Self-pay | Admitting: Emergency Medicine

## 2015-04-01 DIAGNOSIS — M5441 Lumbago with sciatica, right side: Secondary | ICD-10-CM

## 2015-04-01 MED ORDER — KETOROLAC TROMETHAMINE 30 MG/ML IJ SOLN
30.0000 mg | Freq: Once | INTRAMUSCULAR | Status: AC
  Administered 2015-04-01: 30 mg via INTRAMUSCULAR

## 2015-04-01 MED ORDER — KETOROLAC TROMETHAMINE 30 MG/ML IJ SOLN
INTRAMUSCULAR | Status: AC
  Filled 2015-04-01: qty 1

## 2015-04-01 MED ORDER — CYCLOBENZAPRINE HCL 5 MG PO TABS: 5.0000 mg | ORAL_TABLET | Freq: Three times a day (TID) | ORAL | Status: DC | PRN

## 2015-04-01 MED ORDER — TRAMADOL HCL 50 MG PO TABS: 50.0000 mg | ORAL_TABLET | Freq: Four times a day (QID) | ORAL | Status: DC | PRN

## 2015-04-01 NOTE — ED Notes (Signed)
C/o lower back pain, side pain.intermittent for one year

## 2015-04-01 NOTE — ED Provider Notes (Signed)
CSN: [id number]     Arrival date & time 04/01/15  1654 History   First MD Initiated Contact with Patient 04/01/15 1746     No chief complaint on file.  (Consider location/radiation/quality/duration/timing/severity/associated sxs/prior Treatment) Patient is a 29 y.o. male presenting with back pain. The history is provided by the patient.  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 Relieved by:  None tried Ineffective treatments:  None tried Associated symptoms: leg pain and paresthesias   Associated symptoms: no abdominal pain, no abdominal swelling, no bladder incontinence, no bowel incontinence, no dysuria, no numbness, no perianal numbness and no weakness     Past Medical History  Diagnosis Date  . Back pain    No past surgical history on file. No family history on file. Social History  Substance Use Topics  . Smoking status: Current Every Day Smoker    Types: Cigarettes  . Smokeless tobacco: Never Used  . Alcohol Use: Yes     Comment: on occasion    Review of Systems  Gastrointestinal: Negative for abdominal pain and bowel incontinence.  Genitourinary: Negative.  Negative for bladder incontinence and dysuria.  Musculoskeletal: Positive for back pain. Negative for joint swelling and gait problem.  Neurological: Positive for paresthesias. Negative for weakness and numbness.  All other systems reviewed and are negative.   Allergies  Review of patient's allergies indicates no known allergies.  Home Medications   Prior to Admission medications   Medication Sig Start Date End Date Taking? Authorizing Provider  acetaminophen (TYLENOL) 500 MG tablet Take 500 mg by mouth every 6 (six) hours as  needed for mild pain.    Historical Provider, MD  cyclobenzaprine (FLEXERIL) 5 MG tablet Take 1 tablet (5 mg total) by mouth 3 (three) times daily as needed for muscle spasms. 04/01/15   Linna HoffJames D Kindl, MD  guaiFENesin (ROBITUSSIN) 100 MG/5ML liquid Take 5-10 mLs (100-200 mg total) by mouth every 4 (four) hours as needed for cough. 08/10/14   Fayrene HelperBowie Tran, PA-C  HYDROcodone-acetaminophen (NORCO/VICODIN) 5-325 MG per tablet Take 1-2 tablets by mouth every 6 (six) hours as needed. 08/09/13   Teressa LowerVrinda Pickering, NP  ibuprofen (ADVIL,MOTRIN) 200 MG tablet Take 200 mg by mouth every 6 (six) hours as needed for mild pain.    Historical Provider, MD  predniSONE (DELTASONE) 10 MG tablet 6 day step down dose 09/04/13   Teressa LowerVrinda Pickering, NP  traMADol (ULTRAM) 50 MG tablet Take 1 tablet (50 mg total) by mouth every 6 (six) hours as needed. 04/01/15   Linna HoffJames D Kindl, MD   Meds Ordered and Administered this Visit   Medications  ketorolac (TORADOL) 30 MG/ML injection 30 mg (30 mg Intramuscular Given 04/01/15 1804)    BP 134/71 mmHg  Pulse 65  Temp(Src) 97.8 F (36.6 C) (Oral)  SpO2 98% No data found.   Physical Exam  Constitutional: He is oriented to person, place, and time. He appears well-developed and well-nourished.  Abdominal: Soft. Bowel sounds are normal.  Musculoskeletal: Normal range of motion. He exhibits tenderness.  Neg slr, nl ehl, distal nvt intact.  Neurological: He is alert and oriented to person, place, and time.  Skin: Skin is warm and dry.  Nursing note and vitals reviewed.  ED Course  Procedures (including critical care time)  Labs Review Labs Reviewed - No data to display  Imaging Review No results found.   Visual Acuity Review  Right Eye Distance:   Left Eye Distance:   Bilateral Distance:    Right Eye Near:   Left Eye Near:    Bilateral Near:         MDM   1. Right-sided low back pain with right-sided sciatica        Linna Hoff, MD 04/01/15 [phone]

## 2015-06-03 ENCOUNTER — Emergency Department (HOSPITAL_COMMUNITY)
Admission: EM | Admit: 2015-06-03 | Discharge: 2015-06-03 | Disposition: A | Discharge status: Home / Self Care | Payer: Self-pay | Attending: Emergency Medicine | Admitting: Emergency Medicine

## 2015-06-03 ENCOUNTER — Emergency Department (HOSPITAL_COMMUNITY): Payer: Self-pay

## 2015-06-03 ENCOUNTER — Encounter (HOSPITAL_COMMUNITY): Payer: Self-pay | Admitting: Emergency Medicine

## 2015-06-03 DIAGNOSIS — G8929 Other chronic pain: Secondary | ICD-10-CM | POA: Insufficient documentation

## 2015-06-03 DIAGNOSIS — M545 Low back pain, unspecified: Secondary | ICD-10-CM

## 2015-06-03 DIAGNOSIS — F17210 Nicotine dependence, cigarettes, uncomplicated: Secondary | ICD-10-CM | POA: Insufficient documentation

## 2015-06-03 MED ORDER — IBUPROFEN 800 MG PO TABS
800.0000 mg | ORAL_TABLET | Freq: Three times a day (TID) | ORAL | Status: DC
Start: 2015-06-03 — End: 2016-04-26

## 2015-06-03 MED ORDER — KETOROLAC TROMETHAMINE 30 MG/ML IJ SOLN
30.0000 mg | Freq: Once | INTRAMUSCULAR | Status: AC
  Administered 2015-06-03: 30 mg via INTRAMUSCULAR
  Filled 2015-06-03: qty 1

## 2015-06-03 MED ORDER — HYDROCODONE-ACETAMINOPHEN 5-325 MG PO TABS
1.0000 | ORAL_TABLET | Freq: Once | ORAL | Status: AC
  Administered 2015-06-03: 1 via ORAL
  Filled 2015-06-03: qty 1

## 2015-06-03 MED ORDER — HYDROCODONE-ACETAMINOPHEN 5-325 MG PO TABS: 1.0000 | ORAL_TABLET | Freq: Four times a day (QID) | ORAL | Status: DC | PRN

## 2015-06-03 NOTE — Discharge Instructions (Signed)
It is important to follow up with an orthopedic physician for management of your back pain. See follow up clinic listed. You will need to call and make an appointment.  Take medications as directed. The pain medication can make you very drowsy - please do not drink or drive on this medication Return to ER for numbness/tingling down legs, fever, incontinence, new or worsening symptoms, or any additional concerns.

## 2015-06-03 NOTE — ED Provider Notes (Signed)
CSN: [id number]     Arrival date & time 06/03/15  1346 History  By signing my name below, I, Joseph Simon, attest that this documentation has been prepared under the direction and in the presence of [employer], PA-C. Electronically Signed: Phillis Simon, ED Scribe. 06/03/2015. 2:15 PM.   Chief Complaint  Patient presents with  . Back Pain   Patient is a 30 y.o. male presenting with back pain. The history is provided by the patient. No language interpreter was used.  Back Pain Location:  Lumbar spine Quality:  Aching Radiates to:  Does not radiate Pain severity:  Mild Onset quality:  Gradual Duration:  8 months Timing:  Intermittent Progression:  Worsening Chronicity:  Chronic Context: lifting heavy objects   Ineffective treatments:  None tried Associated symptoms: no abdominal pain, no bladder incontinence, no bowel incontinence, no chest pain, no fever, no numbness and no weakness   HPI Comments: Joseph Simon is a 30 y.o. Male with a hx of chronic back pain 2/2 injury who presents to the Emergency Department complaining of constant sharp midline lower back pain. Over the last three years, patient has struggled with back pain. This episode of acute worsening began this morning. Pt has been evaluated in the ED before for the same, but states that the pain today is different and more severe than his chronic pain. He states that he does heavy lifting at work, but denies any known or acute injury to the lower back to cause the pain. Pt reports a work related back injury many years ago that initially started his pain. He reports worsening pain with any type of movement. He has been told he needs to see a specialist, but unable to go due to financial reason. He denies IV drug use, recent steroids, fever, chills, bladder or bowel incontinence, extremity pain, numbness, or weakness.   Past Medical History  Diagnosis Date  . Back pain    History reviewed. No pertinent past surgical  history. History reviewed. No pertinent family history. Social History  Substance Use Topics  . Smoking status: Current Every Day Smoker    Types: Cigarettes  . Smokeless tobacco: Never Used  . Alcohol Use: Yes     Comment: on occasion    Review of Systems  Constitutional: Negative for fever and chills.  HENT: Negative for congestion.   Eyes: Negative for visual disturbance.  Respiratory: Negative for shortness of breath.   Cardiovascular: Negative for chest pain.  Gastrointestinal: Negative for abdominal pain and bowel incontinence.  Genitourinary: Negative for bladder incontinence.  Musculoskeletal: Positive for back pain. Negative for arthralgias and neck pain.  Allergic/Immunologic: Negative for immunocompromised state.  Neurological: Negative for weakness and numbness.   Allergies  Review of patient's allergies indicates no known allergies.  Home Medications   Prior to Admission medications   Medication Sig Start Date End Date Taking? Authorizing Provider  acetaminophen (TYLENOL) 500 MG tablet Take 500 mg by mouth every 6 (six) hours as needed for mild pain.    Historical Provider, MD  cyclobenzaprine (FLEXERIL) 5 MG tablet Take 1 tablet (5 mg total) by mouth 3 (three) times daily as needed for muscle spasms. 04/01/15   Linna Hoff, MD  guaiFENesin (ROBITUSSIN) 100 MG/5ML liquid Take 5-10 mLs (100-200 mg total) by mouth every 4 (four) hours as needed for cough. Patient not taking: Reported on 04/01/2015 08/10/14   Fayrene Helper, PA-C  HYDROcodone-acetaminophen (NORCO/VICODIN) 5-325 MG tablet Take 1 tablet by mouth every 6 (  six) hours as needed. 06/03/15   Joua Bake Pilcher Layton Naves, PA-C  ibuprofen (ADVIL,MOTRIN) 800 MG tablet Take 1 tablet (800 mg total) by mouth 3 (three) times daily. 06/03/15   Chase Picket Itzel Mckibbin, PA-C  predniSONE (DELTASONE) 10 MG tablet 6 day step down dose Patient not taking: Reported on 04/01/2015 09/04/13   Teressa Lower, NP  traMADol (ULTRAM) 50 MG tablet Take  1 tablet (50 mg total) by mouth every 6 (six) hours as needed. 04/01/15   Linna Hoff, MD   BP 161/84 mmHg  Pulse 82  Temp(Src) 98.2 F (36.8 C) (Oral)  Resp 19  SpO2 100% Physical Exam  Constitutional: He is oriented to person, place, and time. He appears well-developed and well-nourished.  Appears in pain but NAD  HENT:  Head: Normocephalic and atraumatic.  Eyes: EOM are normal.  Neck: Normal range of motion. Neck supple.  Full ROM No midline tenderness No tenderness of paraspinal musculature  Cardiovascular: Normal rate, regular rhythm, normal heart sounds and intact distal pulses.  Exam reveals no gallop and no friction rub.   No murmur heard. Pulmonary/Chest: Effort normal and breath sounds normal. No respiratory distress. He has no wheezes. He has no rales.  Abdominal: Soft. Bowel sounds are normal. He exhibits no distension. There is no tenderness.  Musculoskeletal:  Patient is able to ambulate No noted deformities or signs of inflammation. No overlying skin changes. Curvature of cervical, thoracic, and lumbar spine within normal limits. TTP as depicted in image. Decreased ROM secondary to pain. Straight leg raises are negative bilaterally for radicular symptoms, however does reproduce bilateral low back pain 5/5 muscle strength of bilateral LE's   Neurological: He is alert and oriented to person, place, and time. He has normal reflexes.  Bilateral lower extremities neurovascularly intact.  Skin: Skin is warm and dry. No rash noted. No erythema.  Psychiatric: He has a normal mood and affect. His behavior is normal.  Nursing note and vitals reviewed.   ED Course  Procedures (including critical care time) DIAGNOSTIC STUDIES: Oxygen Saturation is 100% on RA, normal by my interpretation.    COORDINATION OF CARE: 2:15 PM-Discussed treatment plan which includes Toradol shot with pt at bedside and pt agreed to plan.    Labs Review Labs Reviewed - No data to  display  Imaging Review Dg Lumbar Spine Complete  06/03/2015  CLINICAL DATA:  Chronic lumbago with intermittent radicular symptoms EXAM: LUMBAR SPINE - COMPLETE 4+ VIEW COMPARISON:  Sep 04, 2013 FINDINGS: Frontal, lateral, spot lumbosacral lateral, and bilateral oblique views were obtained. There are 5 non-rib-bearing lumbar type vertebral bodies. There is minimal levoscoliosis. There is no fracture or spondylolisthesis. The disc spaces appear unremarkable. There is no appreciable facet arthropathy. IMPRESSION: Slight scoliosis. No fracture or spondylolisthesis. No appreciable arthropathy. Electronically Signed   By: Bretta Bang III M.D.   On: 06/03/2015 14:41   I have personally reviewed and evaluated these images and lab results as part of my medical decision-making.   EKG Interpretation None      MDM   Final diagnoses:  Midline low back pain without sciatica   Joseph Simon presents with back pain. History of chronic back pain for the last three years after sustaining an injury. Patient states pain is typically bilaterally - today pain is more central than usual, therefore will obtain imaging. No neurological deficits and normal neuro exam. Patient can walk but states is painful.  No loss of bowel or bladder control.  No concern for  cauda equina. No fever, night sweats, weight loss, h/o cancer, IVDU. RICE protocol and pain medicine indicated and discussed with patient. Stressed the importance of orthopedic follow up and referral was given. Patient was given strict return precautions including new numbness/tingling, incontinence, fever, or worsening pain despite treatment.   I personally performed the services described in this documentation, which was scribed in my presence. The recorded information has been reviewed and is accurate.  [facility] Seanmichael Salmons, PA-C 06/03/15 1514  Mancel Bale, MD 06/03/15 1550

## 2015-06-03 NOTE — ED Notes (Signed)
Pt reports midline lower back pain for the past 8 months that has gradually gotten worse. Pt reports he has been here for same before. Denies any acute injury, but he does do heavy lifting as a part of his job.

## 2015-07-11 ENCOUNTER — Emergency Department (HOSPITAL_COMMUNITY)
Admission: EM | Admit: 2015-07-11 | Discharge: 2015-07-11 | Disposition: A | Discharge status: Home / Self Care | Payer: Self-pay | Attending: Emergency Medicine | Admitting: Emergency Medicine

## 2015-07-11 ENCOUNTER — Encounter (HOSPITAL_COMMUNITY): Payer: Self-pay | Admitting: Emergency Medicine

## 2015-07-11 ENCOUNTER — Emergency Department (INDEPENDENT_AMBULATORY_CARE_PROVIDER_SITE_OTHER)
Admission: EM | Admit: 2015-07-11 | Discharge: 2015-07-11 | Disposition: A | Discharge status: Home / Self Care | Payer: Self-pay | Source: Home / Self Care | Attending: Family Medicine | Admitting: Family Medicine

## 2015-07-11 DIAGNOSIS — J111 Influenza due to unidentified influenza virus with other respiratory manifestations: Secondary | ICD-10-CM

## 2015-07-11 DIAGNOSIS — F17210 Nicotine dependence, cigarettes, uncomplicated: Secondary | ICD-10-CM | POA: Insufficient documentation

## 2015-07-11 DIAGNOSIS — R51 Headache: Secondary | ICD-10-CM | POA: Insufficient documentation

## 2015-07-11 DIAGNOSIS — R509 Fever, unspecified: Secondary | ICD-10-CM | POA: Insufficient documentation

## 2015-07-11 MED ORDER — TRAMADOL HCL 50 MG PO TABS: 50.0000 mg | ORAL_TABLET | Freq: Four times a day (QID) | ORAL | Status: DC | PRN

## 2015-07-11 MED ORDER — ONDANSETRON HCL 4 MG PO TABS: 4.0000 mg | ORAL_TABLET | Freq: Four times a day (QID) | ORAL | Status: DC

## 2015-07-11 MED ORDER — IBUPROFEN 800 MG PO TABS
800.0000 mg | ORAL_TABLET | Freq: Once | ORAL | Status: AC
  Administered 2015-07-11: 800 mg via ORAL

## 2015-07-11 MED ORDER — OSELTAMIVIR PHOSPHATE 75 MG PO CAPS: 75.0000 mg | ORAL_CAPSULE | Freq: Two times a day (BID) | ORAL | Status: DC

## 2015-07-11 MED ORDER — IBUPROFEN 800 MG PO TABS
ORAL_TABLET | ORAL | Status: AC
  Filled 2015-07-11: qty 1

## 2015-07-11 NOTE — ED Provider Notes (Signed)
CSN: [id number]     Arrival date & time 07/11/15  1636 History   First MD Initiated Contact with Patient 07/11/15 1916     No chief complaint on file.  (Consider location/radiation/quality/duration/timing/severity/associated sxs/prior Treatment) HPI Comments: 30 year old male complaining of headache, bodyaches, myalgias, shivering, chills and nausea without vomiting. He states he has had a fever and currently 102.8.  He states he never gets the flu shot because he heard that it gives you the flu.   Past Medical History  Diagnosis Date  . Back pain    No past surgical history on file. No family history on file. Social History  Substance Use Topics  . Smoking status: Current Every Day Smoker    Types: Cigarettes  . Smokeless tobacco: Never Used  . Alcohol Use: Yes     Comment: on occasion    Review of Systems  Constitutional: Positive for fever, chills, activity change, appetite change and fatigue.  HENT: Negative for congestion, ear pain, facial swelling, rhinorrhea and sore throat.   Eyes: Negative.   Respiratory: Negative for cough, choking, shortness of breath and wheezing.   Cardiovascular: Negative.   Gastrointestinal: Positive for nausea and diarrhea. Negative for vomiting and abdominal pain.  Genitourinary: Negative.   Musculoskeletal: Positive for myalgias. Negative for neck pain and neck stiffness.  Skin: Negative.   Neurological: Positive for headaches.  Psychiatric/Behavioral: Negative.     Allergies  Review of patient's allergies indicates no known allergies.  Home Medications   Prior to Admission medications   Medication Sig Start Date End Date Taking? Authorizing Provider  acetaminophen (TYLENOL) 500 MG tablet Take 500 mg by mouth every 6 (six) hours as needed for mild pain.    Historical Provider, MD  cyclobenzaprine (FLEXERIL) 5 MG tablet Take 1 tablet (5 mg total) by mouth 3 (three) times daily as needed for muscle spasms. 04/01/15   Linna Hoff, MD   HYDROcodone-acetaminophen (NORCO/VICODIN) 5-325 MG tablet Take 1 tablet by mouth every 6 (six) hours as needed. 06/03/15   Jaime Pilcher Ward, PA-C  ibuprofen (ADVIL,MOTRIN) 800 MG tablet Take 1 tablet (800 mg total) by mouth 3 (three) times daily. 06/03/15   Jaime Pilcher Ward, PA-C  ondansetron (ZOFRAN) 4 MG tablet Take 1 tablet (4 mg total) by mouth every 6 (six) hours. 07/11/15   Hayden Rasmussen, NP  oseltamivir (TAMIFLU) 75 MG capsule Take 1 capsule (75 mg total) by mouth 2 (two) times daily. X 5 days 07/11/15   Hayden Rasmussen, NP  traMADol (ULTRAM) 50 MG tablet Take 1 tablet (50 mg total) by mouth every 6 (six) hours as needed. 07/11/15   Hayden Rasmussen, NP   Meds Ordered and Administered this Visit   Medications  ibuprofen (ADVIL,MOTRIN) tablet 800 mg (not administered)    BP 150/90 mmHg  Pulse 85  Temp(Src) 102.8 F (39.3 C) (Oral)  Resp 20  SpO2 100% No data found.   Physical Exam  Constitutional: He is oriented to person, place, and time. He appears well-developed and well-nourished. No distress.  HENT:  Mouth/Throat: No oropharyngeal exudate.  Bilateral TMs are normal Oropharynx is clear moist.   Eyes: Conjunctivae and EOM are normal.  Neck: Normal range of motion. Neck supple.  Cardiovascular: Normal rate, regular rhythm and normal heart sounds.   Pulmonary/Chest: Effort normal and breath sounds normal. No respiratory distress. He has no wheezes. He has no rales.  Musculoskeletal: Normal range of motion. He exhibits no edema.  Lymphadenopathy:    He has no cervical  adenopathy.  Neurological: He is alert and oriented to person, place, and time.  Skin: Skin is warm and dry. No rash noted.  Psychiatric: He has a normal mood and affect.  Nursing note and vitals reviewed.   ED Course  Procedures (including critical care time)  Labs Review Labs Reviewed - No data to display  Imaging Review No results found.   Visual Acuity Review  Right Eye Distance:   Left Eye Distance:    Bilateral Distance:    Right Eye Near:   Left Eye Near:    Bilateral Near:         MDM   1. Influenza    Ibuprofen 800 mg every 8 hours as needed for aches and pains and fever or you may take Aleve if that is your choice. Zofran for nausea. Drink plenty of fluids stay well hydrated Rest Tramadol 50 mg # 15    Hayden Rasmussenavid Brynnan Rodenbaugh, NP 07/11/15 1945

## 2015-07-11 NOTE — ED Notes (Signed)
Joseph Simon received a call that the Pt is at an Urgent Care.  Will d/c Pt.

## 2015-07-11 NOTE — ED Notes (Signed)
Complaints of flu, body aches, headaches, fever of 102, chills, sweating.  Reports mild nausea, mild vomiting, mild diarrhea

## 2015-07-11 NOTE — ED Notes (Signed)
Pt complaint of headache, generalized, body aches, and fever onset yesterday.

## 2015-07-11 NOTE — Discharge Instructions (Signed)
Influenza, Adult Ibuprofen 800 mg every 8 hours as needed for aches and pains and fever or you may take Aleve if that is your choice. Zofran for nausea. Drink plenty of fluids stay well hydrated Rest Influenza ("the flu") is a viral infection of the respiratory tract. It occurs more often in winter months because people spend more time in close contact with one another. Influenza can make you feel very sick. Influenza easily spreads from person to person (contagious). CAUSES  Influenza is caused by a virus that infects the respiratory tract. You can catch the virus by breathing in droplets from an infected person's cough or sneeze. You can also catch the virus by touching something that was recently contaminated with the virus and then touching your mouth, nose, or eyes. RISKS AND COMPLICATIONS You may be at risk for a more severe case of influenza if you smoke cigarettes, have diabetes, have chronic heart disease (such as heart failure) or lung disease (such as asthma), or if you have a weakened immune system. Elderly people and pregnant women are also at risk for more serious infections. The most common problem of influenza is a lung infection (pneumonia). Sometimes, this problem can require emergency medical care and may be life threatening. SIGNS AND SYMPTOMS  Symptoms typically last 4 to 10 days and may include:  Fever.  Chills.  Headache, body aches, and muscle aches.  Sore throat.  Chest discomfort and cough.  Poor appetite.  Weakness or feeling tired.  Dizziness.  Nausea or vomiting. DIAGNOSIS  Diagnosis of influenza is often made based on your history and a physical exam. A nose or throat swab test can be done to confirm the diagnosis. TREATMENT  In mild cases, influenza goes away on its own. Treatment is directed at relieving symptoms. For more severe cases, your health care provider may prescribe antiviral medicines to shorten the sickness. Antibiotic medicines are not  effective because the infection is caused by a virus, not by bacteria. HOME CARE INSTRUCTIONS  Take medicines only as directed by your health care provider.  Use a cool mist humidifier to make breathing easier.  Get plenty of rest until your temperature returns to normal. This usually takes 3 to 4 days.  Drink enough fluid to keep your urine clear or pale yellow.  Cover yourmouth and nosewhen coughing or sneezing,and wash your handswellto prevent thevirusfrom spreading.  Stay homefromwork orschool untilthe fever is gonefor at least 601full day. PREVENTION  An annual influenza vaccination (flu shot) is the best way to avoid getting influenza. An annual flu shot is now routinely recommended for all adults in the [place] SEEK MEDICAL CARE IF:  You experiencechest pain, yourcough worsens,or you producemore mucus.  Youhave nausea,vomiting, ordiarrhea.  Your fever returns or gets worse. SEEK IMMEDIATE MEDICAL CARE IF:  You havetrouble breathing, you become short of breath,or your skin ornails becomebluish.  You have severe painor stiffnessin the neck.  You develop a sudden headache, or pain in the face or ear.  You have nausea or vomiting that you cannot control. MAKE SURE YOU:   Understand these instructions.  Will watch your condition.  Will get help right away if you are not doing well or get worse.   This information is not intended to replace advice given to you by your health care provider. Make sure you discuss any questions you have with your health care provider.   Document Released: 04/20/2000 Document Revised: 05/14/2014 Document Reviewed: 07/23/2011 Elsevier Interactive Patient Education [employer]2016 Elsevier Inc.

## 2016-04-25 ENCOUNTER — Ambulatory Visit: Payer: BLUE CROSS/BLUE SHIELD | Admitting: Physician Assistant

## 2016-04-26 ENCOUNTER — Ambulatory Visit (INDEPENDENT_AMBULATORY_CARE_PROVIDER_SITE_OTHER): Payer: Self-pay | Admitting: Physician Assistant

## 2016-04-26 ENCOUNTER — Encounter: Payer: Self-pay | Admitting: Physician Assistant

## 2016-04-26 VITALS — BP 134/86 | HR 78 | Ht 75.0 in | Wt 220.0 lb

## 2016-04-26 DIAGNOSIS — J3089 Other allergic rhinitis: Secondary | ICD-10-CM | POA: Insufficient documentation

## 2016-04-26 DIAGNOSIS — G8929 Other chronic pain: Secondary | ICD-10-CM

## 2016-04-26 DIAGNOSIS — M5441 Lumbago with sciatica, right side: Secondary | ICD-10-CM

## 2016-04-26 MED ORDER — MELOXICAM 15 MG PO TABS: 15.0000 mg | ORAL_TABLET | Freq: Every day | ORAL | 0 refills | Status: DC

## 2016-04-26 MED ORDER — FLUTICASONE PROPIONATE 50 MCG/ACT NA SUSP: 1.0000 | Freq: Every day | NASAL | 2 refills | Status: DC

## 2016-04-26 NOTE — Patient Instructions (Signed)
Please check your blood pressures at home twice a day at the same time each day Return in 1 month with your blood pressure log We will check blood work in 1 month at your next visit. If possible, please fast (nothing to eat or drink except water after midnight). Thank you and Happy Holidays!   Azelastine; Fluticasone nasal spray What is this medicine? AZELASTINE; FLUTICASONE (a ZEL as teen; floo TIK a sone) is a combination of a histamine blocker and a corticosteroid. This medicine is used to treat the symptoms of allergies like sneezing, itching, and runny or stuffy nose. COMMON BRAND NAME(S): Dymista What should I tell my health care provider before I take this medicine? They need to know if you have any of these conditions: -cataracts -glaucoma -infection, like tuberculosis, herpes, or fungal infection -recent surgery or injury of the nose or sinuses -taking a corticosteroid by mouth -an unusual or allergic reaction to azelastine, fluticasone, steroids, other medicines, foods, dyes, or preservatives -pregnant or trying to get pregnant -breast-feeding How should I use this medicine? This medicine is for use in the nose. Follow the directions on the prescription label. Shake well before using. Do not use more often than directed. Make sure that you are using your nasal spray correctly. Ask you doctor or health care provider if you have any questions. Talk to your pediatrician regarding the use of this medicine in children. While this drug may be prescribed for children as young as 6 years for selected conditions, precautions do apply. What if I miss a dose? If you miss a dose, use it as soon as you can. If it is almost time for your next dose, use only that dose. Do not use double or extra doses. What may interact with this medicine? -alcohol -certain medicines for anxiety or sleep -cimetidine -ketoconazole -metyrapone -other antihistamines -some medicines for HIV -vaccines What  should I watch for while using this medicine? Tell your doctor or healthcare professional if your symptoms do not start to get better or if they get worse. You may get drowsy or dizzy. Drinking alcohol or taking medicine that causes drowsiness can make this worse. Do not drive, use machinery, or do anything that needs mental alertness until you know how this medicine affects you. This medicine may increase your risk of getting an infection. Tell your doctor or health care professional if you are around anyone with measles or chickenpox, or if you develop sores or blisters that do not heal properly. What side effects may I notice from receiving this medicine? Side effects that you should report to your doctor or health care professional as soon as possible: -allergic reactions like skin rash, itching or hives, swelling of the face, lips, or tongue -breathing problems -changes in vision -fast heartbeat -flu-like symptoms -high blood pressure -infection -nose bleeding, sores -white patches or sores in the mouth or nose Side effects that usually do not require medical attention (report to your doctor or health care professional if they continue or are bothersome): -changes in smell or taste -cough -feeling tired -headache -larger appetite or weight gain -nose or throat irritation -sneezing Where should I keep my medicine? Keep out of the reach of children. Store upright and tightly closed at room temperature between 20 and 25 degrees C (68 and 77 degrees F). Do not freeze. Throw away any unused medicine after the expiration date or after 120 sprays, whichever comes first.  2017 Elsevier/Gold Standard (2015-05-26 12:04:59)

## 2016-04-26 NOTE — Progress Notes (Signed)
HPI:                                                                Joseph Simon is a 30 y.o. male [facility] today to establish care and BP concerns  Blood pressure: patient was recently seen by [facility] for work clearance and had an elevated BP reading. He has no personal hx of HTN. Family hx is significant for HTN in his parents. Denies headache, chest pain, dyspnea on exertion, or peripheral edema.  Low back pain: onset was 6 months ago. Pain is midline and radiates to his hip and lateral leg. Reports "sciatica" and tingling in his leg. Denies bowel or bladder symptoms.  Health Maintenance Health Maintenance  Topic Date Due  . TETANUS/TDAP  05/04/2005  . INFLUENZA VACCINE  12/06/2015  . HIV Screening  Completed    Health Habits  Diet: rates his diet as fair, 1 caffeinated beverage/day  Exercise: not regularly exercising  ETOH: yes, socially  Tobacco: yes, 3 packyears  Drugs: no  Dental Exam: no  Eye Exam: no  Past Medical History:  Diagnosis Date  . Back pain    History reviewed. No pertinent surgical history. Social History  Substance Use Topics  . Smoking status: Current Every Day Smoker    Types: Cigarettes  . Smokeless tobacco: Never Used  . Alcohol use Yes     Comment: on occasion   family history includes Alcohol abuse in his maternal grandfather; Diabetes in his maternal grandfather and maternal grandmother; Heart attack in his paternal grandfather; Hypertension in his father; Stroke in his paternal grandfather.  Review of Systems  Constitutional: Negative.   HENT: Positive for congestion and sore throat.   Eyes: Negative.   Respiratory: Negative.   Cardiovascular: Negative.   Gastrointestinal: Negative.   Genitourinary: Negative.   Musculoskeletal: Positive for back pain.  Skin: Negative.   Neurological: Positive for tingling (right leg) and sensory change (right leg).   Endo/Heme/Allergies: Negative.   Psychiatric/Behavioral: Negative.     Medications: Current Outpatient Prescriptions  Medication Sig Dispense Refill  . fluticasone (FLONASE) 50 MCG/ACT nasal spray Place 1 spray into both nostrils daily. 16 g 2  . meloxicam (MOBIC) 15 MG tablet Take 1 tablet (15 mg total) by mouth daily. 30 tablet 0   No current facility-administered medications for this visit.    No Known Allergies   Objective:  BP 134/86   Pulse 78   Ht 6\' 3"  (1.905 m)   Wt 220 lb (99.8 kg)   BMI 27.50 kg/m  Gen: well-groomed, not ill-appearing, no distress HEENT: Extraocular movements intact, normal conjunctiva, TM's clear, oropharynx mildly erythematous, tonsillar hypertrophy without exudates, moist mucus membranes, no thyromegaly or tenderness, mild cervical lymphadenopathy Lungs: Normal work of breathing, clear to auscultation bilaterally Heart: Normal rate, regular rhythm, s1 and s2 distinct, no murmurs, clicks or rubs appreciated on this exam Abd: Soft. Nondistended, Nontender Extremities: distal pulses intact, no peripheral edema Neuro: alert and oriented x 3, normal gait, sensation grossly intact Skin: warm and dry, no rashes or lesions on exposed skin Psych: normal affect, pleasant mood, normal speech   No results found for this or any previous visit (from the past 72 hour(s)).  No results found.    Assessment and Plan: 30 y.o. male with concern for essential hypertension. BP is in the upper limits of normal today. He is going to purchase a home BP cuff and check his pressures daily and followup in 1 month He would like to return for fasting labs in the new year when he has new health insurance  1. Chronic midline low back pain with right-sided sciatica - meloxicam (MOBIC) 15 MG tablet; Take 1 tablet (15 mg total) by mouth daily.  Dispense: 30 tablet; Refill: 0  2. Chronic non-seasonal allergic rhinitis, unspecified trigger - fluticasone (FLONASE) 50 MCG/ACT  nasal spray; Place 1 spray into both nostrils daily.  Dispense: 16 g; Refill: 2  Patient education and anticipatory guidance given Patient agrees with treatment plan Follow-up in 1 month or sooner as needed  Levonne Hubertharley E. Estiben Mizuno PA-C

## 2016-05-01 ENCOUNTER — Ambulatory Visit: Payer: BLUE CROSS/BLUE SHIELD | Admitting: Physician Assistant

## 2016-05-25 ENCOUNTER — Ambulatory Visit: Payer: BLUE CROSS/BLUE SHIELD | Admitting: Physician Assistant

## 2016-06-19 ENCOUNTER — Other Ambulatory Visit: Payer: Self-pay | Admitting: Emergency Medicine

## 2016-06-19 ENCOUNTER — Ambulatory Visit (INDEPENDENT_AMBULATORY_CARE_PROVIDER_SITE_OTHER): Payer: Self-pay

## 2016-06-19 DIAGNOSIS — M542 Cervicalgia: Secondary | ICD-10-CM

## 2016-06-19 DIAGNOSIS — M25511 Pain in right shoulder: Secondary | ICD-10-CM

## 2016-07-23 ENCOUNTER — Encounter (HOSPITAL_COMMUNITY): Payer: Self-pay | Admitting: Emergency Medicine

## 2016-07-23 ENCOUNTER — Emergency Department (HOSPITAL_COMMUNITY)
Admission: EM | Admit: 2016-07-23 | Discharge: 2016-07-23 | Disposition: A | Discharge status: Home / Self Care | Payer: BLUE CROSS/BLUE SHIELD | Attending: Emergency Medicine | Admitting: Emergency Medicine

## 2016-07-23 DIAGNOSIS — F17210 Nicotine dependence, cigarettes, uncomplicated: Secondary | ICD-10-CM | POA: Insufficient documentation

## 2016-07-23 DIAGNOSIS — R112 Nausea with vomiting, unspecified: Secondary | ICD-10-CM

## 2016-07-23 DIAGNOSIS — R1084 Generalized abdominal pain: Secondary | ICD-10-CM

## 2016-07-23 DIAGNOSIS — Z79899 Other long term (current) drug therapy: Secondary | ICD-10-CM | POA: Insufficient documentation

## 2016-07-23 LAB — URINALYSIS, ROUTINE W REFLEX MICROSCOPIC
Bilirubin Urine: NEGATIVE
GLUCOSE, UA: NEGATIVE mg/dL
Hgb urine dipstick: NEGATIVE
Ketones, ur: NEGATIVE mg/dL
Leukocytes, UA: NEGATIVE
Nitrite: NEGATIVE
Protein, ur: NEGATIVE mg/dL
Specific Gravity, Urine: 1.013 (ref 1.005–1.030)
pH: 8 (ref 5.0–8.0)

## 2016-07-23 LAB — CBC
HEMATOCRIT: 43.7 % (ref 39.0–52.0)
HEMOGLOBIN: 15.5 g/dL (ref 13.0–17.0)
MCH: 31 pg (ref 26.0–34.0)
MCHC: 35.5 g/dL (ref 30.0–36.0)
MCV: 87.4 fL (ref 78.0–100.0)
Platelets: 292 10*3/uL (ref 150–400)
RBC: 5 MIL/uL (ref 4.22–5.81)
RDW: 13.6 % (ref 11.5–15.5)
WBC: 12.8 10*3/uL — ABNORMAL HIGH (ref 4.0–10.5)

## 2016-07-23 LAB — COMPREHENSIVE METABOLIC PANEL
ALT: 30 U/L (ref 17–63)
ANION GAP: 9 (ref 5–15)
AST: 33 U/L (ref 15–41)
Albumin: 4.2 g/dL (ref 3.5–5.0)
Alkaline Phosphatase: 58 U/L (ref 38–126)
BILIRUBIN TOTAL: 0.3 mg/dL (ref 0.3–1.2)
BUN: 14 mg/dL (ref 6–20)
CO2: 28 mmol/L (ref 22–32)
Calcium: 9.5 mg/dL (ref 8.9–10.3)
Chloride: 102 mmol/L (ref 101–111)
Creatinine, Ser: 1.18 mg/dL (ref 0.61–1.24)
GFR calc Af Amer: >60 mL/min (ref >60)
Glucose, Bld: 107 mg/dL — ABNORMAL HIGH (ref 65–99)
POTASSIUM: 4 mmol/L (ref 3.5–5.1)
Sodium: 139 mmol/L (ref 135–145)
TOTAL PROTEIN: 7.2 g/dL (ref 6.5–8.1)

## 2016-07-23 LAB — RAPID URINE DRUG SCREEN, HOSP PERFORMED
AMPHETAMINES: NOT DETECTED
Barbiturates: NOT DETECTED
Benzodiazepines: NOT DETECTED
Cocaine: NOT DETECTED
Opiates: NOT DETECTED
Tetrahydrocannabinol: POSITIVE — AB

## 2016-07-23 LAB — LIPASE, BLOOD: Lipase: 35 U/L (ref 11–51)

## 2016-07-23 MED ORDER — FENTANYL CITRATE (PF) 100 MCG/2ML IJ SOLN
50.0000 ug | Freq: Once | INTRAMUSCULAR | Status: AC
  Administered 2016-07-23: 50 ug via INTRAVENOUS
  Filled 2016-07-23: qty 2

## 2016-07-23 MED ORDER — SODIUM CHLORIDE 0.9 % IV BOLUS (SEPSIS)
1000.0000 mL | Freq: Once | INTRAVENOUS | Status: AC
  Administered 2016-07-23: 1000 mL via INTRAVENOUS

## 2016-07-23 MED ORDER — ONDANSETRON HCL 4 MG/2ML IJ SOLN
4.0000 mg | Freq: Once | INTRAMUSCULAR | Status: AC
  Administered 2016-07-23: 4 mg via INTRAVENOUS
  Filled 2016-07-23: qty 2

## 2016-07-23 NOTE — ED Triage Notes (Signed)
Patient is from home.  He is complaining of abdominal pain in all four quadrants.  He has some N/V.  Pain started today.  Patient has shoulder pain on left side, which he is being treated for.  He is been given Cyclobeuzaprine.  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

## 2016-07-23 NOTE — ED Notes (Signed)
Patient is aware we need urine.  Asked him twice

## 2016-07-23 NOTE — ED Provider Notes (Signed)
[facility]-EMERGENCY DEPT Provider Note   CSN: [id number] Arrival date & time: 07/23/16 4098     History    Chief Complaint  Patient presents with  . Abdominal Pain     HPI Joseph Simon is a 31 y.o. male.  31yo M who p/w Abdominal pain and vomiting. Approximately 4 hours prior to arrival, the patient began having generalized abdominal pain that has been constant and severe. No radiation to his back. Nothing makes the pain better or worse. He has had associated nausea and vomiting. He reports recent constipation, last bowel movement was yesterday. No diarrhea, fevers, or urinary symptoms. He has never had pain like this before. He denies any recent travel or changes in his diet. No sick contacts.  Past Medical History:  Diagnosis Date  . Back pain      Patient Active Problem List   Diagnosis Date Noted  . Chronic midline low back pain with right-sided sciatica 04/26/2016  . Chronic non-seasonal allergic rhinitis 04/26/2016    History reviewed. No pertinent surgical history.      Home Medications    Prior to Admission medications   Medication Sig Start Date End Date Taking? Authorizing Provider  cyclobenzaprine (FLEXERIL) 5 MG tablet Take 5 mg by mouth 2 (two) times daily as needed for muscle spasms.   Yes Historical Provider, MD  fluticasone (FLONASE) 50 MCG/ACT nasal spray Place 1 spray into both nostrils daily. Patient not taking: Reported on 07/23/2016 04/26/16   Carlis Stable, PA-C  meloxicam (MOBIC) 15 MG tablet Take 1 tablet (15 mg total) by mouth daily. Patient not taking: Reported on 07/23/2016 04/26/16   Carlis Stable, PA-C      Family History  Problem Relation Age of Onset  . Hypertension Father   . Diabetes Maternal Grandmother   . Alcohol abuse Maternal Grandfather   . Diabetes Maternal Grandfather   . Heart attack Paternal Grandfather   . Stroke Paternal Grandfather      Social History  Substance Use Topics  . Smoking  status: Current Every Day Smoker    Types: Cigarettes  . Smokeless tobacco: Never Used  . Alcohol use Yes     Comment: on occasion     Allergies     Patient has no known allergies.    Review of Systems  10 Systems reviewed and are negative for acute change except as noted in the HPI.   Physical Exam Updated Vital Signs BP 134/87 (BP Location: Right Arm)   Pulse 63   Temp 98 F (36.7 C) (Oral)   Resp 16   Ht 6\' 3"  (1.905 m)   Wt 225 lb (102.1 kg)   SpO2 100%   BMI 28.12 kg/m   Physical Exam  Constitutional: He is oriented to person, place, and time. He appears well-developed and well-nourished. He appears distressed.  Eyes closed, in mild distress due to pain  HENT:  Head: Normocephalic and atraumatic.  Moist mucous membranes  Eyes: Conjunctivae are normal. Pupils are equal, round, and reactive to light.  Neck: Neck supple.  Cardiovascular: Normal rate, regular rhythm and normal heart sounds.   No murmur heard. Pulmonary/Chest: Effort normal and breath sounds normal.  Abdominal: Soft. He exhibits no distension. Bowel sounds are increased. There is generalized tenderness. There is no rebound.  Mild voluntary guarding  Musculoskeletal: He exhibits no edema.  Neurological: He is alert and oriented to person, place, and time.  Fluent speech  Skin: Skin is warm and dry.  Psychiatric:  He has a normal mood and affect. Judgment normal.  Nursing note and vitals reviewed.     ED Treatments / Results  Labs (all labs ordered are listed, but only abnormal results are displayed) Labs Reviewed  COMPREHENSIVE METABOLIC PANEL - Abnormal; Notable for the following:       Result Value   Glucose, Bld 107 (*)    All other components within normal limits  CBC - Abnormal; Notable for the following:    WBC 12.8 (*)    All other components within normal limits  URINALYSIS, ROUTINE W REFLEX MICROSCOPIC - Abnormal; Notable for the following:    APPearance CLOUDY (*)    All  other components within normal limits  RAPID URINE DRUG SCREEN, HOSP PERFORMED - Abnormal; Notable for the following:    Tetrahydrocannabinol POSITIVE (*)    All other components within normal limits  LIPASE, BLOOD     EKG  EKG Interpretation  Date/Time:    Ventricular Rate:    PR Interval:    QRS Duration:   QT Interval:    QTC Calculation:   R Axis:     Text Interpretation:           Radiology No results found.  Procedures Procedures (including critical care time) Procedures  Medications Ordered in ED  Medications  sodium chloride 0.9 % bolus 1,000 mL (0 mLs Intravenous Stopped 07/23/16 1015)  ondansetron (ZOFRAN) injection 4 mg (4 mg Intravenous Given 07/23/16 0858)  fentaNYL (SUBLIMAZE) injection 50 mcg (50 mcg Intravenous Given 07/23/16 0858)     Initial Impression / Assessment and Plan / ED Course  I have reviewed the triage vital signs and the nursing notes.  Pertinent labs & imaging results that were available during my care of the patient were reviewed by me and considered in my medical decision making (see chart for details).     Pt w/ generalized abdominal pain w/ N/V that began this morning. He was in mild distress on exam. Vital signs unremarkable. He had generalized abdominal tenderness, increased bowel sounds. No peritonitis. Obtained above lab work and gave the patient Zofran, fentanyl, and denies fluid bolus.  Labwork shows reassuring CMP, WBC 12.8, UA unremarkable. On reexamination, the patient was well-appearing, stating that his nausea and vomiting had completely resolved, and his abdominal pain had almost completely resolved as well. He stated he felt much better. He tolerated ginger ale with no difficulty. Given that his symptoms have resolved with conservative treatment here and he has no ongoing pain, I do not feel he needs any abdominal imaging at this time. I have discussed supportive measures at home and emphasized the importance of returning  if his abdominal pain returns or worsens. Patient voiced understanding and was discharged in satisfactory condition.  Final Clinical Impressions(s) / ED Diagnoses   Final diagnoses:  Generalized abdominal pain  Non-intractable vomiting with nausea, unspecified vomiting type     Discharge Medication List as of 07/23/2016 12:19 PM         Laurence Spatesachel Morgan Affie Gasner, MD 07/23/16 1749

## 2016-07-23 NOTE — ED Notes (Signed)
Bed: [id number] Expected date:  Expected time:  Means of arrival:  Comments: EMS 31 yo abd pain

## 2016-11-23 IMAGING — CR DG LUMBAR SPINE COMPLETE 4+V
5 series · 5 of 5 positions shown · non-contrast
Comparison: September 04, 2013

CLINICAL DATA: Chronic lumbago with intermittent radicular symptoms

EXAM:
LUMBAR SPINE - COMPLETE 4+ VIEW

[t lumbar spine ap]
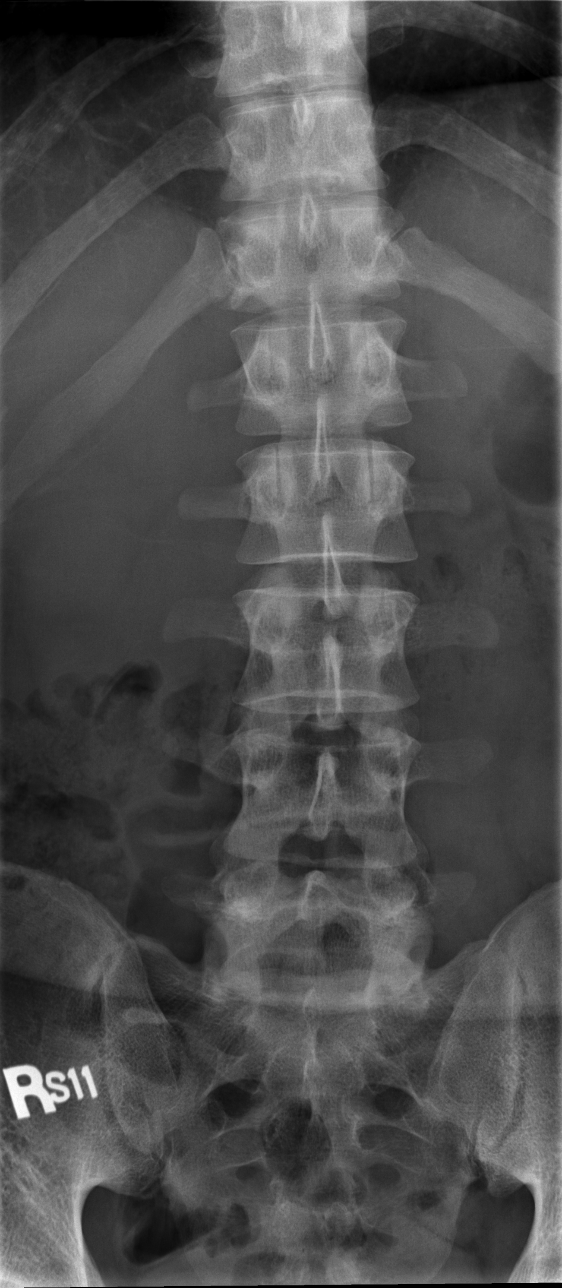

[t lumbar spine obl (1 of 2)]
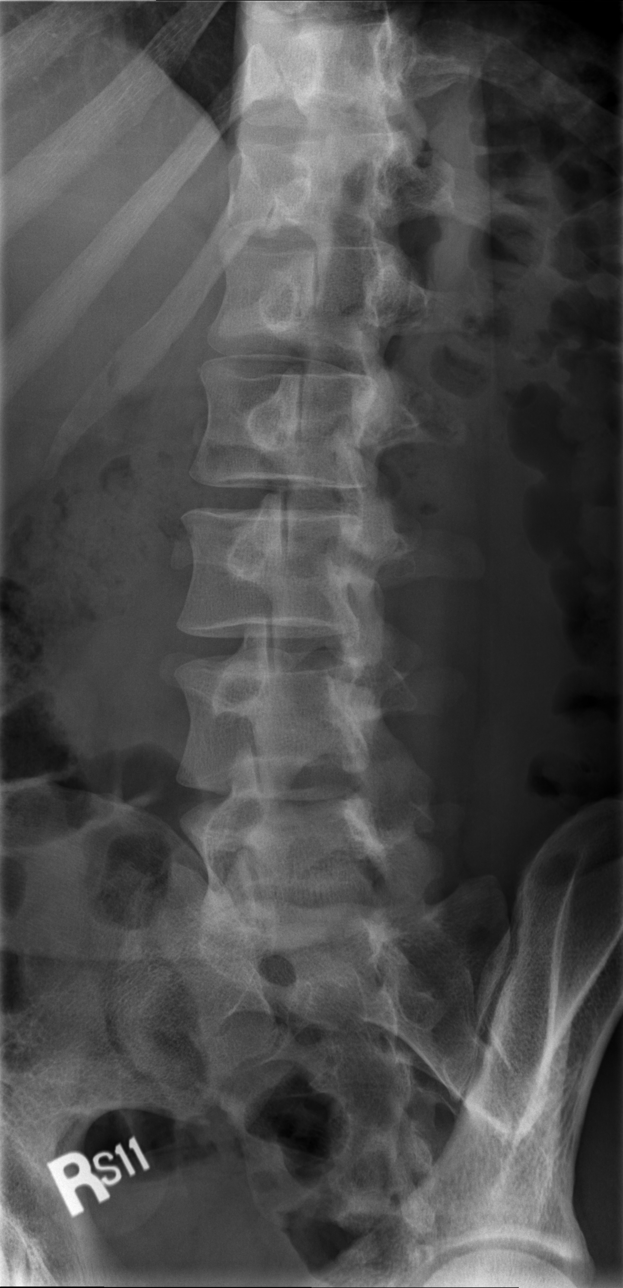

[t lumbar spine obl (2 of 2)]
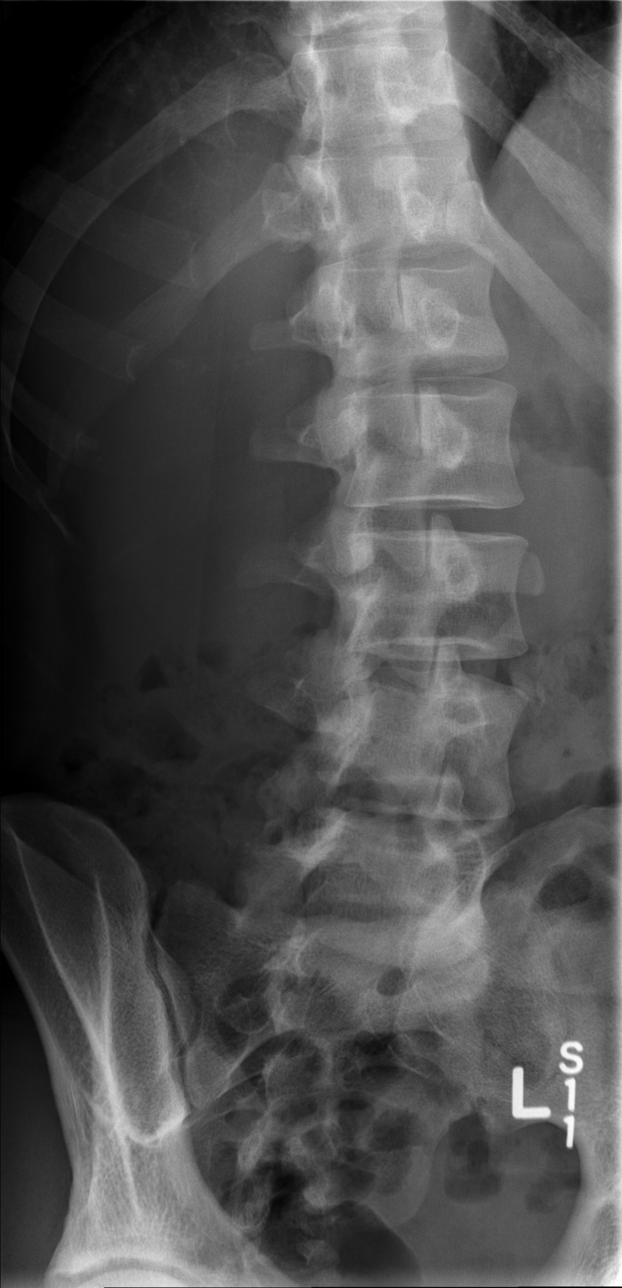

[t lumbar spine lat]
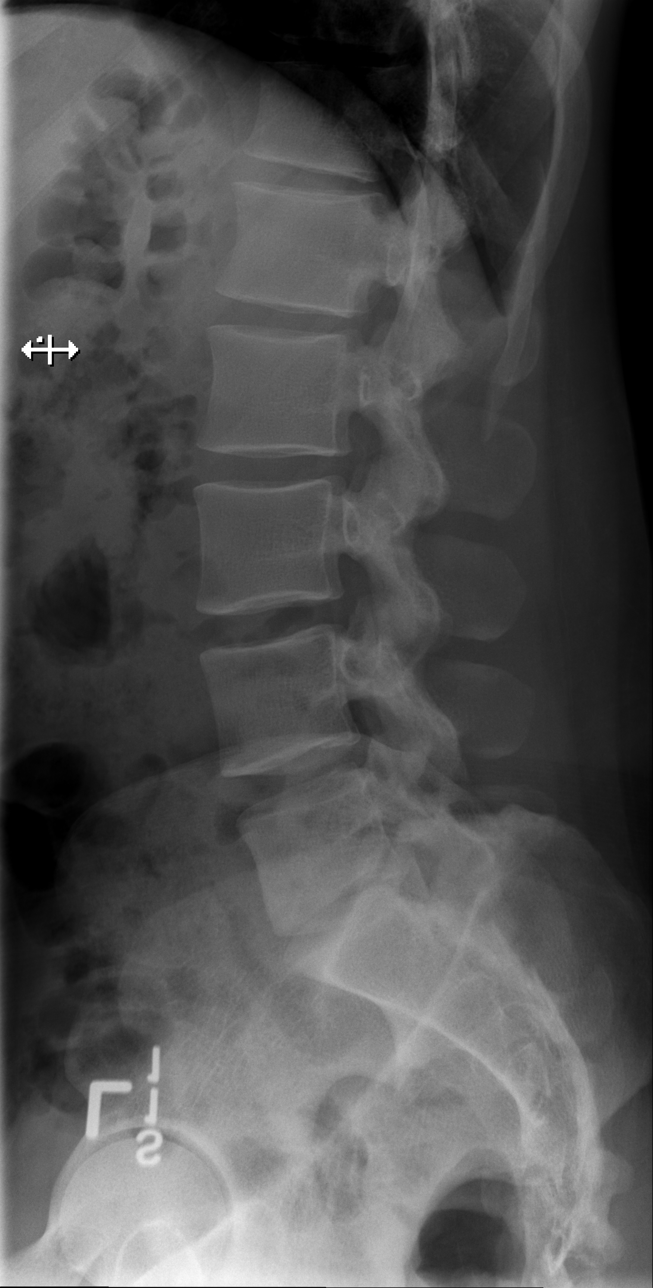

[t lumbar l-5 s-1 spot]
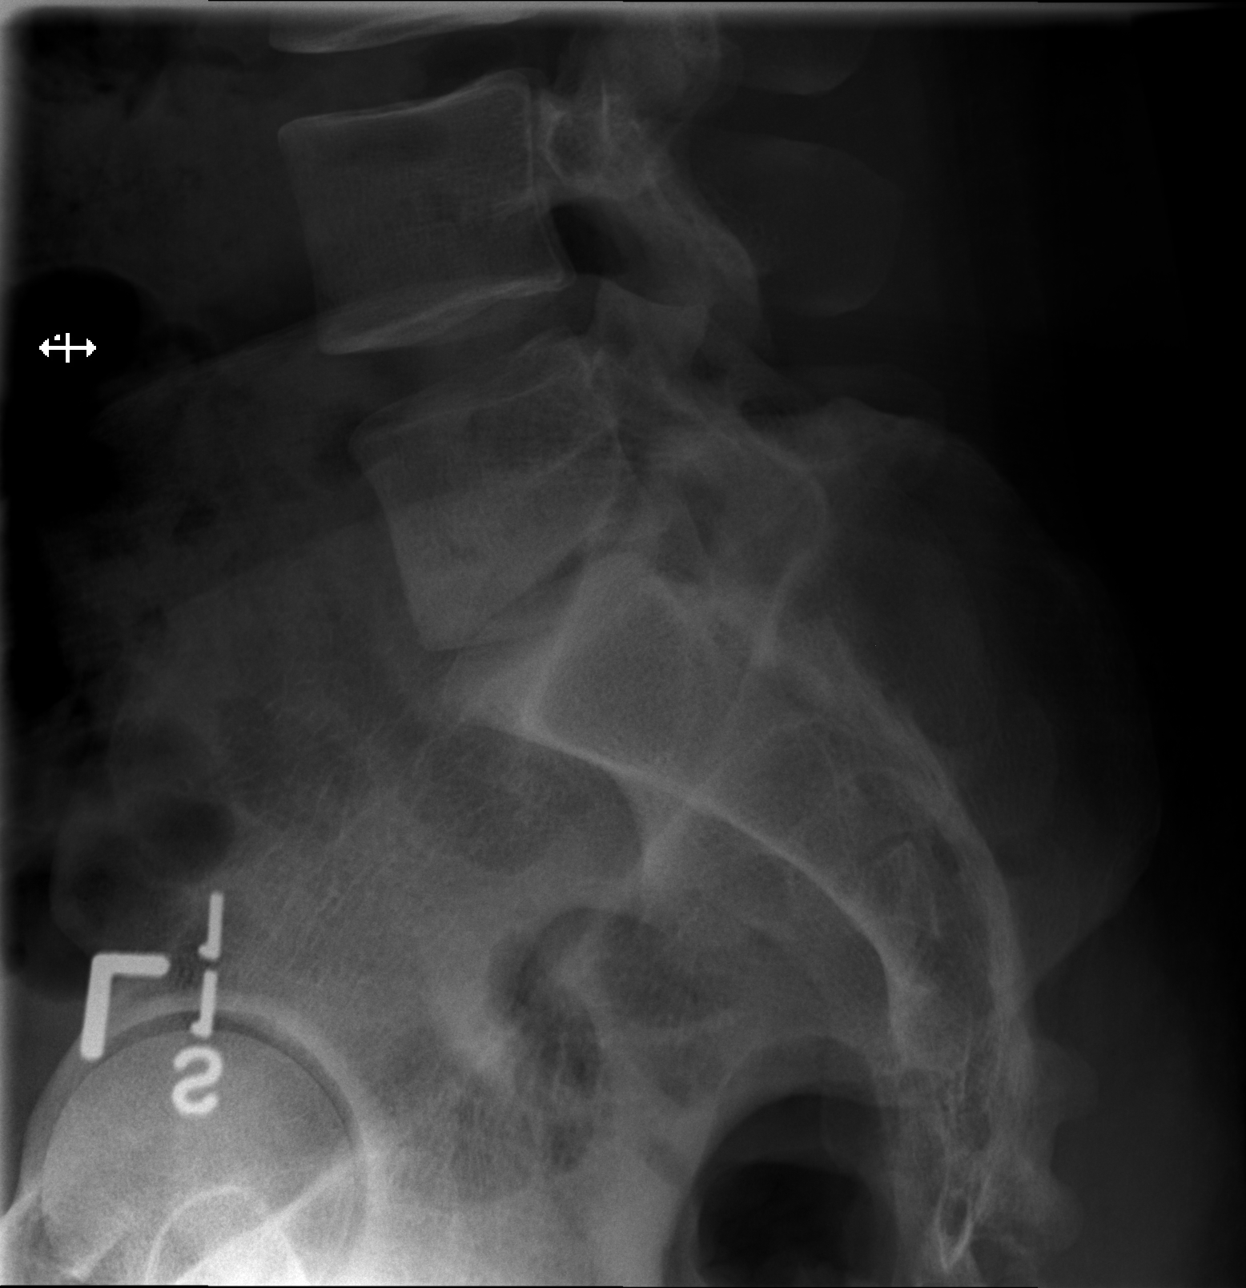

[5 of 5 positions shown; findings below may reference images not displayed]

FINDINGS: Frontal, lateral, spot lumbosacral lateral, and bilateral oblique
views were obtained. There are 5 non-rib-bearing lumbar type
vertebral bodies. There is minimal levoscoliosis. There is no
fracture or spondylolisthesis. The disc spaces appear unremarkable.
There is no appreciable facet arthropathy.
IMPRESSION: Slight scoliosis. No fracture or spondylolisthesis. No appreciable
arthropathy.

## 2018-01-10 ENCOUNTER — Encounter (HOSPITAL_BASED_OUTPATIENT_CLINIC_OR_DEPARTMENT_OTHER): Payer: Self-pay | Admitting: *Deleted

## 2018-01-10 ENCOUNTER — Emergency Department (HOSPITAL_BASED_OUTPATIENT_CLINIC_OR_DEPARTMENT_OTHER)
Admission: EM | Admit: 2018-01-10 | Discharge: 2018-01-10 | Disposition: A | Discharge status: Home / Self Care | Payer: Self-pay | Attending: Emergency Medicine | Admitting: Emergency Medicine

## 2018-01-10 ENCOUNTER — Emergency Department (HOSPITAL_BASED_OUTPATIENT_CLINIC_OR_DEPARTMENT_OTHER): Payer: Self-pay

## 2018-01-10 ENCOUNTER — Other Ambulatory Visit: Payer: Self-pay

## 2018-01-10 DIAGNOSIS — F17210 Nicotine dependence, cigarettes, uncomplicated: Secondary | ICD-10-CM | POA: Insufficient documentation

## 2018-01-10 DIAGNOSIS — B9789 Other viral agents as the cause of diseases classified elsewhere: Secondary | ICD-10-CM | POA: Insufficient documentation

## 2018-01-10 DIAGNOSIS — J069 Acute upper respiratory infection, unspecified: Secondary | ICD-10-CM | POA: Insufficient documentation

## 2018-01-10 LAB — GROUP A STREP BY PCR: Group A Strep by PCR: NOT DETECTED

## 2018-01-10 MED ORDER — IPRATROPIUM-ALBUTEROL 0.5-2.5 (3) MG/3ML IN SOLN
3.0000 mL | Freq: Once | RESPIRATORY_TRACT | Status: AC
  Administered 2018-01-10: 3 mL via RESPIRATORY_TRACT
  Filled 2018-01-10: qty 3

## 2018-01-10 MED ORDER — IBUPROFEN 800 MG PO TABS
ORAL_TABLET | ORAL | Status: AC
  Filled 2018-01-10: qty 1

## 2018-01-10 MED ORDER — IBUPROFEN 800 MG PO TABS
800.0000 mg | ORAL_TABLET | Freq: Once | ORAL | Status: AC
  Administered 2018-01-10: 800 mg via ORAL

## 2018-01-10 NOTE — ED Triage Notes (Signed)
Sore throat x 2 days

## 2018-01-10 NOTE — ED Provider Notes (Signed)
[facility] EMERGENCY DEPARTMENT Provider Note  CSN: [id number] Arrival date & time: 01/10/18  1347    History   Chief Complaint Chief Complaint  Patient presents with  . Sore Throat    HPI Joseph Simon is a 32 y.o. male with a medical history of allergic rhinitis who presented to the ED for various upper respiratory symptoms x2 days. Endorses subjective fever, myalgias, sinus pressure, nasal congestion, rhinorrhea, sore throat, SOB/wheezes and cough with sputum production. Denies recent travel, known sick contacts or new exposures. Patient has tried OTC cold and congestion products at home with minimal relief. Denies chest pain, leg swelling, palpitations, hemoptysis, abdominal pain and N/V.   Past Medical History:  Diagnosis Date  . Back pain     Patient Active Problem List   Diagnosis Date Noted  . Chronic midline low back pain with right-sided sciatica 04/26/2016  . Chronic non-seasonal allergic rhinitis 04/26/2016    History reviewed. No pertinent surgical history.      Home Medications    Prior to Admission medications   Medication Sig Start Date End Date Taking? Authorizing Provider  cyclobenzaprine (FLEXERIL) 5 MG tablet Take 5 mg by mouth 2 (two) times daily as needed for muscle spasms.    [provider]  fluticasone (FLONASE) 50 MCG/ACT nasal spray Place 1 spray into both nostrils daily. Patient not taking: Reported on 07/23/2016 04/26/16   Carlis Stable, PA-C  meloxicam (MOBIC) 15 MG tablet Take 1 tablet (15 mg total) by mouth daily. Patient not taking: Reported on 07/23/2016 04/26/16   Carlis Stable, PA-C    Family History Family History  Problem Relation Age of Onset  . Hypertension Father   . Diabetes Maternal Grandmother   . Alcohol abuse Maternal Grandfather   . Diabetes Maternal Grandfather   . Heart attack Paternal Grandfather   . Stroke Paternal Grandfather     Social History Social History    Tobacco Use  . Smoking status: Current Every Day Smoker    Types: Cigarettes  . Smokeless tobacco: Never Used  Substance Use Topics  . Alcohol use: Yes    Comment: on occasion  . Drug use: No     Allergies   Patient has no known allergies.   Review of Systems Review of Systems  Constitutional: Positive for fatigue and fever.  HENT: Positive for congestion, rhinorrhea, sinus pressure, sneezing and sore throat. Negative for ear pain, postnasal drip and trouble swallowing.   Eyes: Negative.   Respiratory: Positive for cough, chest tightness and shortness of breath.   Cardiovascular: Negative for chest pain, palpitations and leg swelling.  Gastrointestinal: Negative.   Genitourinary: Negative.   Musculoskeletal: Positive for myalgias.  Skin: Negative.   Neurological: Negative.    Physical Exam Updated Vital Signs BP (!) 144/95   Pulse 89   Temp 99 F (37.2 C) (Oral)   Resp 20   Ht 6\' 4"  (1.93 m)   Wt 114.8 kg   SpO2 98%   BMI 30.80 kg/m   Physical Exam  Constitutional: He appears well-developed and well-nourished.  Appears unwell.  HENT:  Head: Normocephalic and atraumatic.  Right Ear: Tympanic membrane, external ear and ear canal normal. No swelling or tenderness. Tympanic membrane is not bulging. No middle ear effusion.  Left Ear: Tympanic membrane, external ear and ear canal normal. No swelling or tenderness. Tympanic membrane is not bulging.  No middle ear effusion.  Nose: Nose normal. Right sinus exhibits no maxillary sinus tenderness and  no frontal sinus tenderness. Left sinus exhibits no maxillary sinus tenderness and no frontal sinus tenderness.  Mouth/Throat: Uvula is midline, oropharynx is clear and moist and mucous membranes are normal. No tonsillar abscesses.  Equal swelling in tonsils bilaterally.  Eyes: Pupils are equal, round, and reactive to light. Conjunctivae, EOM and lids are normal.  Neck: Trachea normal, normal range of motion, full passive  range of motion without pain and phonation normal. Neck supple. No spinous process tenderness and no muscular tenderness present. No neck rigidity. Normal range of motion present.  Cardiovascular: Normal rate, regular rhythm and normal heart sounds.  No murmur heard. Pulmonary/Chest: Effort normal. No tachypnea. No respiratory distress. He has wheezes in the right middle field and the right lower field.  Lymphadenopathy:    He has no cervical adenopathy.  Skin: Skin is warm. No rash noted. He is not diaphoretic.  Nursing note and vitals reviewed.    ED Treatments / Results  Labs (all labs ordered are listed, but only abnormal results are displayed) Labs Reviewed  GROUP A STREP BY PCR    EKG None  Radiology Dg Chest 2 View  Result Date: 01/10/2018 CLINICAL DATA:  32 y/o  M; cough, fever, and congestion for 3 days. EXAM: CHEST - 2 VIEW COMPARISON:  12/25/2012 chest radiograph FINDINGS: Stable heart size and mediastinal contours are within normal limits. Both lungs are clear. The visualized skeletal structures are unremarkable. IMPRESSION: No acute pulmonary process identified. Electronically Signed   By: Mitzi Hansen M.D.   On: 01/10/2018 14:58    Procedures Procedures (including critical care time)  Medications Ordered in ED Medications  ibuprofen (ADVIL,MOTRIN) 800 MG tablet (has no administration in time range)  ipratropium-albuterol (DUONEB) 0.5-2.5 (3) MG/3ML nebulizer solution 3 mL (3 mLs Nebulization Given 01/10/18 1503)  ibuprofen (ADVIL,MOTRIN) tablet 800 mg (800 mg Oral Given 01/10/18 1503)     Initial Impression / Assessment and Plan / ED Course  Triage vital signs and the nursing notes have been reviewed.  Pertinent labs & imaging results that were available during care of the patient were reviewed and considered in medical decision making (see chart for details).  Patient presents afebrile with numerous upper respiratory complaints. On physical exam,  wheezes were heard unilaterally in right lung fields. No clear rhonchi or crackles appreciated through wheezes, but patient endorses SOB, cough and fever since illness began which is concerning for PNA. Remaining history is consistent with viral etiology.   Clinical Course as of Jan 11 1515  Fri Jan 10, 2018  1446 Strep test negative.   [GM]  1510 CXR normal no signs of PNA.   [GM]  1512 Duo-Neb treatment given for wheezes. Lung sounds clear to auscultation bilaterally with resolved wheezing.   [GM]    Clinical Course User Index [GM] Ramanda Paules, Sharyon Medicus, PA-C   Final Clinical Impressions(s) / ED Diagnoses  1. Viral URI. Education provided on OTC and supportive treatment for symptom relief.  Dispo: Home. After thorough clinical evaluation, this patient is determined to be medically stable and can be safely discharged with the previously mentioned treatment and/or outpatient follow-up/referral(s). At this time, there are no other apparent medical conditions that require further screening, evaluation or treatment.   Final diagnoses:  Viral upper respiratory tract infection    ED Discharge Orders    None        Windy Carina, [place] 01/10/18 1516    Alvira Monday, MD 01/11/18 [phone]

## 2018-01-10 NOTE — ED Notes (Signed)
Patient transported to X-ray 

## 2018-01-10 NOTE — Discharge Instructions (Addendum)
Your strep test was negative. Chest x-ray was also normal which means you do not have pneumonia.  Your symptoms are most likely from a virus. Symptoms should begin to improve over the next 3-5 days. You can continue taking the cold/flu and congestion medications you have bought over-the-counter. As we discussed,

## 2018-03-17 ENCOUNTER — Other Ambulatory Visit: Payer: Self-pay

## 2018-03-17 ENCOUNTER — Emergency Department (HOSPITAL_BASED_OUTPATIENT_CLINIC_OR_DEPARTMENT_OTHER)
Admission: EM | Admit: 2018-03-17 | Discharge: 2018-03-17 | Disposition: A | Discharge status: Home / Self Care | Payer: Self-pay | Attending: Emergency Medicine | Admitting: Emergency Medicine

## 2018-03-17 ENCOUNTER — Encounter (HOSPITAL_BASED_OUTPATIENT_CLINIC_OR_DEPARTMENT_OTHER): Payer: Self-pay | Admitting: *Deleted

## 2018-03-17 DIAGNOSIS — M5442 Lumbago with sciatica, left side: Secondary | ICD-10-CM | POA: Insufficient documentation

## 2018-03-17 DIAGNOSIS — G8929 Other chronic pain: Secondary | ICD-10-CM | POA: Insufficient documentation

## 2018-03-17 DIAGNOSIS — F17210 Nicotine dependence, cigarettes, uncomplicated: Secondary | ICD-10-CM | POA: Insufficient documentation

## 2018-03-17 MED ORDER — METHYLPREDNISOLONE 4 MG PO TBPK: ORAL_TABLET | ORAL | 0 refills | Status: DC

## 2018-03-17 MED ORDER — MELOXICAM 15 MG PO TABS: 15.0000 mg | ORAL_TABLET | Freq: Every day | ORAL | 0 refills | Status: DC

## 2018-03-17 MED FILL — MELOXICAM 15 MG TABLET: 15 | 30 days supply | Qty: 30 | Fill #0

## 2018-03-17 MED FILL — METHYLPREDNISOLONE 4 MG TAB: 4 | 6 days supply | Qty: 21 | Fill #0

## 2018-03-17 NOTE — ED Triage Notes (Signed)
Pt /o left lower back pain and "years" increased pain x 2 month radiates into left hip and down leg

## 2018-03-17 NOTE — Discharge Instructions (Addendum)
SEEK IMMEDIATE MEDICAL ATTENTION IF: New numbness, tingling, weakness, or problem with the use of your arms or legs.  Severe back pain not relieved with medications.  Change in bowel or bladder control.  Increasing pain in any areas of the body (such as chest or abdominal pain).  Shortness of breath, dizziness or fainting.  Nausea (feeling sick to your stomach), vomiting, fever, or sweats.  

## 2018-03-17 NOTE — ED Notes (Signed)
ED Provider at bedside. 

## 2018-03-17 NOTE — ED Provider Notes (Signed)
[facility] EMERGENCY DEPARTMENT Provider Note   CSN: [id number] Arrival date & time: 03/17/18  1513     History   Chief Complaint Chief Complaint  Patient presents with  . Back Pain    HPI Joseph Simon is a 32 y.o. male who presents with a cc of back pain. He has no significant past medical history.  Patient states that he normally has low back pain that spreads across his lower back.  He is also had episodes of pain radiating down his left buttock, the back of his left leg down the left lateral side of his lower extremity and has some numbness of the foot on occasion as well.  He states that at times it is severe 8 out of 10 pain but he can tolerate and manage it with Motrin and other times it is about a 4 out of 10 but constant.  He does work at [employer] and does a lot of lifting a and twisting.  He denies any saddle anesthesia, with weakness in the lower extremities, loss of bowel or bladder continence.  He denies any history of IV drug use or procedures to his back.  Patient states that he was "just sick of it" tonight.  Currently rates his pain at 6 out of 10.   HPI  Past Medical History:  Diagnosis Date  . Back pain     Patient Active Problem List   Diagnosis Date Noted  . Chronic midline low back pain with right-sided sciatica 04/26/2016  . Chronic non-seasonal allergic rhinitis 04/26/2016    History reviewed. No pertinent surgical history.      Home Medications    Prior to Admission medications   Medication Sig Start Date End Date Taking? Authorizing Provider  fluticasone (FLONASE) 50 MCG/ACT nasal spray Place 1 spray into both nostrils daily. Patient not taking: Reported on 07/23/2016 04/26/16   Carlis Stable, PA-C    Family History Family History  Problem Relation Age of Onset  . Hypertension Father   . Diabetes Maternal Grandmother   . Alcohol abuse Maternal Grandfather   . Diabetes Maternal Grandfather   . Heart attack  Paternal Grandfather   . Stroke Paternal Grandfather     Social History Social History   Tobacco Use  . Smoking status: Current Every Day Smoker    Packs/day: 0.50    Types: Cigarettes  . Smokeless tobacco: Never Used  Substance Use Topics  . Alcohol use: Yes    Comment: on occasion  . Drug use: No     Allergies   Patient has no known allergies.   Review of Systems Review of Systems  Ten systems reviewed and are negative for acute change, except as noted in the HPI.   Physical Exam Updated Vital Signs BP 138/76 (BP Location: Left Arm)   Pulse 68   Temp 98.7 F (37.1 C) (Oral)   Resp 18   Ht 6\' 4"  (1.93 m)   Wt 108.9 kg   SpO2 99%   BMI 29.21 kg/m   Physical Exam  Constitutional: He appears well-developed and well-nourished. No distress.  HENT:  Head: Normocephalic and atraumatic.  Eyes: Conjunctivae are normal. No scleral icterus.  Neck: Normal range of motion. Neck supple.  Cardiovascular: Normal rate, regular rhythm and normal heart sounds.  Pulmonary/Chest: Effort normal and breath sounds normal. No respiratory distress.  Abdominal: Soft. There is no tenderness.  Musculoskeletal: He exhibits no edema.  Patient appears to be in mild to  moderate pain, antalgic gait noted. Lumbosacral spine area reveals no local tenderness or mass. Painful and reduced LS ROM noted. Straight leg raise is positive at 45 degrees on right. DTR's, motor strength and sensation normal, including heel and toe gait.  Peripheral pulses are palpable.   Neurological: He is alert.  Skin: Skin is warm and dry. He is not diaphoretic.  Psychiatric: His behavior is normal.  Nursing note and vitals reviewed.    ED Treatments / Results  Labs (all labs ordered are listed, but only abnormal results are displayed) Labs Reviewed - No data to display  EKG None  Radiology No results found.  Procedures Procedures (including critical care time)  Medications Ordered in ED Medications -  No data to display   Initial Impression / Assessment and Plan / ED Course  I have reviewed the triage vital signs and the nursing notes.  Pertinent labs & imaging results that were available during my care of the patient were reviewed by me and considered in my medical decision making (see chart for details).     Patient with back pain.  No neurological deficits and normal neuro exam.  Patient can walk but states is painful.  No loss of bowel or bladder control.  No concern for cauda equina.  No fever, night sweats, weight loss, h/o cancer, IVDU.  RICE protocol and pain medicine indicated and discussed with patient.    Final Clinical Impressions(s) / ED Diagnoses   Final diagnoses:  Chronic bilateral low back pain with left-sided sciatica    ED Discharge Orders    None       Arthor Captain, PA-C 03/17/18 1839    Azalia Bilis, MD 03/17/18 1939

## 2018-04-02 ENCOUNTER — Emergency Department (HOSPITAL_BASED_OUTPATIENT_CLINIC_OR_DEPARTMENT_OTHER)
Admission: EM | Admit: 2018-04-02 | Discharge: 2018-04-02 | Disposition: A | Discharge status: Home / Self Care | Payer: Self-pay | Attending: Emergency Medicine | Admitting: Emergency Medicine

## 2018-04-02 ENCOUNTER — Other Ambulatory Visit: Payer: Self-pay

## 2018-04-02 ENCOUNTER — Encounter (HOSPITAL_BASED_OUTPATIENT_CLINIC_OR_DEPARTMENT_OTHER): Payer: Self-pay

## 2018-04-02 DIAGNOSIS — R112 Nausea with vomiting, unspecified: Secondary | ICD-10-CM | POA: Insufficient documentation

## 2018-04-02 DIAGNOSIS — F17210 Nicotine dependence, cigarettes, uncomplicated: Secondary | ICD-10-CM | POA: Insufficient documentation

## 2018-04-02 DIAGNOSIS — Z79899 Other long term (current) drug therapy: Secondary | ICD-10-CM | POA: Insufficient documentation

## 2018-04-02 MED ORDER — ONDANSETRON 4 MG PO TBDP: 4.0000 mg | ORAL_TABLET | Freq: Three times a day (TID) | ORAL | 0 refills | Status: DC | PRN

## 2018-04-02 MED ORDER — ONDANSETRON 4 MG PO TBDP
4.0000 mg | ORAL_TABLET | Freq: Once | ORAL | Status: AC
  Administered 2018-04-02: 4 mg via ORAL
  Filled 2018-04-02: qty 1

## 2018-04-02 MED ORDER — POLYETHYLENE GLYCOL 3350 17 G PO PACK: 17.0000 g | PACK | Freq: Every day | ORAL | 0 refills | Status: DC

## 2018-04-02 NOTE — ED Triage Notes (Signed)
C/o n/v x today-NAD-steady gait 

## 2018-04-02 NOTE — Discharge Instructions (Addendum)
You were seen in the ER today for nausea, vomiting, and constipation. Your symptoms may be related to constipation and or a viral GI illness. We are sending you home with zofran- an anti-nausea medication you may place below your tongue and allow to deserve every 8 hour as needed for nausea/vomiting as well as miralax- a medication you may take once per day to help with constipation. Please also follow the attached diet guidelines for constipation. Be sure to drink plenty of water and stay well hydrated.   We have prescribed you new medication(s) today. Discuss the medications prescribed today with your pharmacist as they can have adverse effects and interactions with your other medicines including over the counter and prescribed medications. Seek medical evaluation if you start to experience new or abnormal symptoms after taking one of these medicines, seek care immediately if you start to experience difficulty breathing, feeling of your throat closing, facial swelling, or rash as these could be indications of a more serious allergic reaction  Please follow up with your primary care provider within 3 days for re-evaluation.

## 2018-04-02 NOTE — ED Provider Notes (Signed)
[facility] EMERGENCY DEPARTMENT Provider Note   CSN: [id number] Arrival date & time: 04/02/18  1609     History   Chief Complaint Chief Complaint  Patient presents with  . Emesis    HPI Joseph Simon is a 32 y.o. male with a hx of tobacco abuse who presents to the ED with complaints of N/V that started today. Patient relays he has had 3 episodes of non bloody non bilious emesis today. 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 He states since last episode of emesis he has tolerated PO Gatorade and is feeling somewhat better. He does not some associated constipation, last BM 2 days prior, he is passing gas without difficulty and has no abdominal pain. No other specific alleviating/aggravating factors. Has not trialed medications prior to arrival. Denies fever, chills, dysuria, hematochezia, melena, hematemesis, or testicular pain/swelling.   HPI  Past Medical History:  Diagnosis Date  . Back pain     Patient Active Problem List   Diagnosis Date Noted  . Chronic midline low back pain with right-sided sciatica 04/26/2016  . Chronic non-seasonal allergic rhinitis 04/26/2016    History reviewed. No pertinent surgical history.      Home Medications    Prior to Admission medications   Medication Sig Start Date End Date Taking? Authorizing Provider  fluticasone (FLONASE) 50 MCG/ACT nasal spray Place 1 spray into both nostrils daily. Patient not taking: Reported on 07/23/2016 04/26/16   Carlis Stable, PA-C  meloxicam (MOBIC) 15 MG tablet Take 1 tablet (15 mg total) by mouth daily. 03/17/18   Arthor Captain, PA-C  methylPREDNISolone (MEDROL DOSEPAK) 4 MG TBPK tablet Use as directed 03/17/18   Arthor Captain, PA-C    Family History Family History  Problem Relation Age of Onset  . Hypertension Father   . Diabetes  Maternal Grandmother   . Alcohol abuse Maternal Grandfather   . Diabetes Maternal Grandfather   . Heart attack Paternal Grandfather   . Stroke Paternal Grandfather     Social History Social History   Tobacco Use  . Smoking status: Current Every Day Smoker    Packs/day: 0.50    Types: Cigarettes  . Smokeless tobacco: Never Used  Substance Use Topics  . Alcohol use: Yes    Comment: occ  . Drug use: No     Allergies   Patient has no known allergies.   Review of Systems Review of Systems  Constitutional: Negative for chills and fever.  Respiratory: Negative for shortness of breath.   Cardiovascular: Negative for chest pain.  Gastrointestinal: Positive for constipation, nausea and vomiting. Negative for abdominal pain, anal bleeding, blood in stool and diarrhea.  Genitourinary: Negative for dysuria, scrotal swelling and testicular pain.  All other systems reviewed and are negative.    Physical Exam Updated Vital Signs BP (!) 144/89 (BP Location: Left Arm)   Pulse 63   Temp 98.5 F (36.9 C) (Oral)   Resp 16   SpO2 100%   Physical Exam  Constitutional: He appears well-developed and well-nourished.  Non-toxic appearance. No distress.  HENT:  Head: Normocephalic and atraumatic.  Eyes: Conjunctivae are normal. Right eye exhibits no discharge. Left eye exhibits no discharge.  Neck: Neck supple.  Cardiovascular: Normal rate and regular rhythm.  Pulmonary/Chest: Effort normal and breath sounds normal. No respiratory  distress. He has no wheezes. He has no rhonchi. He has no rales.  Respiration even and unlabored  Abdominal: Soft. Bowel sounds are normal. He exhibits no distension. There is no tenderness. There is no rigidity, no rebound, no guarding and no CVA tenderness.  Neurological: He is alert.  Clear speech.   Skin: Skin is warm and dry. No rash noted.  Psychiatric: He has a normal mood and affect. His behavior is normal.  Nursing note and vitals reviewed.  ED  Treatments / Results  Labs (all labs ordered are listed, but only abnormal results are displayed) Labs Reviewed - No data to display  EKG None  Radiology No results found.  Procedures Procedures (including critical care time)  Medications Ordered in ED Medications  ondansetron (ZOFRAN-ODT) disintegrating tablet 4 mg (4 mg Oral Given 04/02/18 1729)     Initial Impression / Assessment and Plan / ED Course  I have reviewed the triage vital signs and the nursing notes.  Pertinent labs & imaging results that were available during my care of the patient were reviewed by me and considered in my medical decision making (see chart for details).   Patient is an otherwise healthy 32 year old male who presents to the ED with complaints of nausea/vomiting today, has also had some constipation, passing gas, no associated abdominal discomfort. Patient nontoxic appearing, in no apparent distress, vitals WNL with the exception of elevated BP. Benign physical exam. We discussed further evaluation with labs which patient adamantly declined. Given patient is well appearing, non tender abdomen without peritoneal signs, passing gas, feel this is reasonable. Low suspicion for acute potentially surgical process such as cholecystitis, appendicitis, diverticulitis, bowel obstruction/performation, or pancreatitis. Likely constipation vs. Viral GI illness. Tolerating PO in the ER. Will discharge home with miralax as well as a few tablets of zofran. PCP follow up with strict return precautions. I discussed treatment plan, need for follow-up, and return precautions with the patient. Provided opportunity for questions, patient confirmed understanding and is in agreement with plan.   Final Clinical Impressions(s) / ED Diagnoses   Final diagnoses:  Non-intractable vomiting with nausea, unspecified vomiting type    ED Discharge Orders         Ordered    polyethylene glycol (MIRALAX) packet  Daily     04/02/18 1732     ondansetron (ZOFRAN ODT) 4 MG disintegrating tablet  Every 8 hours PRN     04/02/18 1732           Cherly Andersonetrucelli, Mileah Hemmer R, PA-C 04/02/18 1739    Little, Ambrose Finlandachel Morgan, MD 04/07/18 [phone]

## 2018-04-02 NOTE — ED Notes (Signed)
Ginger Ale given for p.o. challenge  

## 2018-04-20 ENCOUNTER — Other Ambulatory Visit: Payer: Self-pay

## 2018-04-20 ENCOUNTER — Encounter (HOSPITAL_BASED_OUTPATIENT_CLINIC_OR_DEPARTMENT_OTHER): Payer: Self-pay | Admitting: Emergency Medicine

## 2018-04-20 ENCOUNTER — Emergency Department (HOSPITAL_BASED_OUTPATIENT_CLINIC_OR_DEPARTMENT_OTHER)
Admission: EM | Admit: 2018-04-20 | Discharge: 2018-04-20 | Disposition: A | Discharge status: Home / Self Care | Payer: Self-pay | Attending: Emergency Medicine | Admitting: Emergency Medicine

## 2018-04-20 DIAGNOSIS — M5442 Lumbago with sciatica, left side: Secondary | ICD-10-CM | POA: Insufficient documentation

## 2018-04-20 DIAGNOSIS — Z79899 Other long term (current) drug therapy: Secondary | ICD-10-CM | POA: Insufficient documentation

## 2018-04-20 DIAGNOSIS — F17210 Nicotine dependence, cigarettes, uncomplicated: Secondary | ICD-10-CM | POA: Insufficient documentation

## 2018-04-20 MED ORDER — MELOXICAM 15 MG PO TABS: 15.0000 mg | ORAL_TABLET | Freq: Every day | ORAL | 0 refills | Status: DC

## 2018-04-20 MED ORDER — LIDO-CAPSAICIN-MEN-METHYL SAL 4-0.025-5-20 % EX PTCH: 1.0000 | MEDICATED_PATCH | Freq: Every day | CUTANEOUS | 0 refills | Status: DC

## 2018-04-20 MED ORDER — DEXAMETHASONE SODIUM PHOSPHATE 10 MG/ML IJ SOLN
10.0000 mg | Freq: Once | INTRAMUSCULAR | Status: AC
  Administered 2018-04-20: 10 mg via INTRAMUSCULAR
  Filled 2018-04-20: qty 1

## 2018-04-20 NOTE — ED Provider Notes (Signed)
[facility] EMERGENCY DEPARTMENT Provider Note   CSN: [id number] Arrival date & time: 04/20/18  0818     History   Chief Complaint Chief Complaint  Patient presents with  . Back Pain    HPI Joseph Simon is a 32 y.o. male.  Patient is a 32 year old male who presents with low back pain.  He has recurrent episodes of low back pain with pain radiating down his left leg.  He has not seen an outpatient physician for this because he is waiting for his benefits to kick in from his job.  He states it started flaring up again yesterday after he been doing some Christmas shopping.  He has pain in his left lower back that radiates to his left hip.  There is no radiation further down the leg.  No numbness or weakness to his leg.  No abdominal pain.  No loss of bowel or bladder control.  No fevers.  He uses meloxicam with some improvement in symptoms although he only has 1 pill left.  He was last on steroids about a month and a half ago.     Past Medical History:  Diagnosis Date  . Back pain     Patient Active Problem List   Diagnosis Date Noted  . Chronic midline low back pain with right-sided sciatica 04/26/2016  . Chronic non-seasonal allergic rhinitis 04/26/2016    History reviewed. No pertinent surgical history.      Home Medications    Prior to Admission medications   Medication Sig Start Date End Date Taking? Authorizing Provider  Lido-Capsaicin-Men-Methyl Sal (1ST MEDX-PATCH/ LIDOCAINE) 4-0.025-5-20 % PTCH Apply 1 patch topically daily. 04/20/18   Rolan BuccoBelfi, Koraima Albertsen, MD  meloxicam (MOBIC) 15 MG tablet Take 1 tablet (15 mg total) by mouth daily. 04/20/18   Rolan BuccoBelfi, Alaura Schippers, MD  methylPREDNISolone (MEDROL DOSEPAK) 4 MG TBPK tablet Use as directed 03/17/18   Arthor CaptainHarris, Abigail, PA-C  ondansetron (ZOFRAN ODT) 4 MG disintegrating tablet Take 1 tablet (4 mg total) by mouth every 8 (eight) hours as needed for nausea or vomiting. 04/02/18   Petrucelli, Samantha R, PA-C    polyethylene glycol (MIRALAX) packet Take 17 g by mouth daily. 04/02/18   Petrucelli, Pleas KochSamantha R, PA-C    Family History Family History  Problem Relation Age of Onset  . Hypertension Father   . Diabetes Maternal Grandmother   . Alcohol abuse Maternal Grandfather   . Diabetes Maternal Grandfather   . Heart attack Paternal Grandfather   . Stroke Paternal Grandfather     Social History Social History   Tobacco Use  . Smoking status: Current Every Day Smoker    Packs/day: 1.00    Types: Cigarettes  . Smokeless tobacco: Never Used  Substance Use Topics  . Alcohol use: Yes    Comment: occ  . Drug use: No     Allergies   Patient has no known allergies.   Review of Systems Review of Systems  Constitutional: Negative for fever.  Gastrointestinal: Negative for nausea and vomiting.  Musculoskeletal: Positive for back pain. Negative for arthralgias, joint swelling and neck pain.  Skin: Negative for wound.  Neurological: Negative for weakness, numbness and headaches.     Physical Exam Updated Vital Signs BP (!) 151/110 (BP Location: Left Arm)   Pulse (!) 56   Temp 98.1 F (36.7 C) (Oral)   Resp 18   Ht 6\' 4"  (1.93 m)   Wt 108.9 kg   SpO2 100%   BMI 29.21 kg/m  Physical Exam Constitutional:      Appearance: He is well-developed.  HENT:     Head: Normocephalic and atraumatic.  Neck:     Musculoskeletal: Normal range of motion and neck supple.  Cardiovascular:     Rate and Rhythm: Normal rate.  Pulmonary:     Effort: Pulmonary effort is normal.  Musculoskeletal:        General: Tenderness present.     Comments: Positive tenderness to his left lower back.  There is no spinal tenderness.  Negative straight leg raise bilaterally.  Patellar reflexes symmetric bilaterally.  He has normal sensation to light touch and motor function in his lower extremities.  Pedal pulses are intact  Skin:    General: Skin is warm and dry.  Neurological:     Mental Status: He is  alert and oriented to person, place, and time.      ED Treatments / Results  Labs (all labs ordered are listed, but only abnormal results are displayed) Labs Reviewed - No data to display  EKG None  Radiology No results found.  Procedures Procedures (including critical care time)  Medications Ordered in ED Medications  dexamethasone (DECADRON) injection 10 mg (has no administration in time range)     Initial Impression / Assessment and Plan / ED Course  I have reviewed the triage vital signs and the nursing notes.  Pertinent labs & imaging results that were available during my care of the patient were reviewed by me and considered in my medical decision making (see chart for details).     Patient is a 32 year old male who presents with low back pain with some radicular symptoms.  There is no neurologic deficits or signs of cauda equina.  He was given a shot of Decadron in the ED and I will refill his meloxicam prescription and add some lidocaine patches.  He was given a referral to follow-up with orthopedics.  Return precautions were given.  Final Clinical Impressions(s) / ED Diagnoses   Final diagnoses:  Acute left-sided low back pain with left-sided sciatica    ED Discharge Orders         Ordered    Lido-Capsaicin-Men-Methyl Sal (1ST MEDX-PATCH/ LIDOCAINE) 4-0.025-5-20 % PTCH  Daily     04/20/18 0905    meloxicam (MOBIC) 15 MG tablet  Daily     04/20/18 0905           Rolan Bucco, MD 04/20/18 [phone]

## 2018-04-20 NOTE — ED Triage Notes (Signed)
Lower back pain since yesterday, denies recent injury . Has hx of same . Denies any urinary symptoms

## 2018-04-20 NOTE — ED Notes (Signed)
ED Provider at bedside. 

## 2018-06-21 ENCOUNTER — Emergency Department (HOSPITAL_BASED_OUTPATIENT_CLINIC_OR_DEPARTMENT_OTHER)
Admission: EM | Admit: 2018-06-21 | Discharge: 2018-06-21 | Disposition: A | Discharge status: Home / Self Care | Payer: Self-pay | Attending: Emergency Medicine | Admitting: Emergency Medicine

## 2018-06-21 ENCOUNTER — Other Ambulatory Visit: Payer: Self-pay

## 2018-06-21 ENCOUNTER — Encounter (HOSPITAL_BASED_OUTPATIENT_CLINIC_OR_DEPARTMENT_OTHER): Payer: Self-pay | Admitting: Emergency Medicine

## 2018-06-21 DIAGNOSIS — R69 Illness, unspecified: Secondary | ICD-10-CM

## 2018-06-21 DIAGNOSIS — J111 Influenza due to unidentified influenza virus with other respiratory manifestations: Secondary | ICD-10-CM | POA: Insufficient documentation

## 2018-06-21 MED ORDER — KETOROLAC TROMETHAMINE 60 MG/2ML IM SOLN
60.0000 mg | Freq: Once | INTRAMUSCULAR | Status: AC
  Administered 2018-06-21: 60 mg via INTRAMUSCULAR
  Filled 2018-06-21: qty 2

## 2018-06-21 MED ORDER — OSELTAMIVIR PHOSPHATE 75 MG PO CAPS
75.0000 mg | ORAL_CAPSULE | Freq: Once | ORAL | Status: AC
  Administered 2018-06-21: 75 mg via ORAL
  Filled 2018-06-21: qty 1

## 2018-06-21 MED ORDER — OSELTAMIVIR PHOSPHATE 75 MG PO CAPS: 75.0000 mg | ORAL_CAPSULE | Freq: Two times a day (BID) | ORAL | 0 refills | Status: DC

## 2018-06-21 MED ORDER — ACETAMINOPHEN 500 MG PO TABS
1000.0000 mg | ORAL_TABLET | Freq: Once | ORAL | Status: AC
  Administered 2018-06-21: 1000 mg via ORAL
  Filled 2018-06-21: qty 2

## 2018-06-21 NOTE — ED Notes (Signed)
Pt took Nyquil cold and flu liquid gels at about 1130 pm

## 2018-06-21 NOTE — Discharge Instructions (Addendum)
Tamiflu as prescribed.  Tylenol 1000 mg rotated with Motrin 600 mg every 4 hours as needed for pain or fever.  Drink plenty of fluids and get plenty of rest.  Follow-up with your primary doctor if symptoms or not improving in the next 4 to 5 days, and return to the ER for severe chest pain, difficulty breathing, or other new and concerning symptoms.

## 2018-06-21 NOTE — ED Provider Notes (Signed)
[facility] EMERGENCY DEPARTMENT Provider Note   CSN: [id number] Arrival date & time: 06/21/18  0130     History   Chief Complaint Chief Complaint  Patient presents with  . Influenza    HPI Joseph Simon is a 33 y.o. male.  Patient is a 33 year old male with no significant past medical history.  He presents today with complaints of body aches, fever, chills that started earlier today.  He also reports headache and dry cough.  He was around his young cousin who is ill in a similar fashion.  The history is provided by the patient.  Influenza  Presenting symptoms: cough, fatigue, fever, headache and myalgias   Presenting symptoms: no diarrhea and no sore throat   Severity:  Severe Onset quality:  Sudden Duration:  12 hours Progression:  Worsening Chronicity:  New Relieved by:  Nothing Worsened by:  Nothing Ineffective treatments: DayQuil.   Past Medical History:  Diagnosis Date  . Back pain     Patient Active Problem List   Diagnosis Date Noted  . Chronic midline low back pain with right-sided sciatica 04/26/2016  . Chronic non-seasonal allergic rhinitis 04/26/2016    History reviewed. No pertinent surgical history.      Home Medications    Prior to Admission medications   Medication Sig Start Date End Date Taking? Authorizing Provider  Lido-Capsaicin-Men-Methyl Sal (1ST MEDX-PATCH/ LIDOCAINE) 4-0.025-5-20 % PTCH Apply 1 patch topically daily. 04/20/18   Rolan Bucco, MD  meloxicam (MOBIC) 15 MG tablet Take 1 tablet (15 mg total) by mouth daily. 04/20/18   Rolan Bucco, MD  methylPREDNISolone (MEDROL DOSEPAK) 4 MG TBPK tablet Use as directed 03/17/18   Arthor Captain, PA-C  ondansetron (ZOFRAN ODT) 4 MG disintegrating tablet Take 1 tablet (4 mg total) by mouth every 8 (eight) hours as needed for nausea or vomiting. 04/02/18   Petrucelli, Samantha R, PA-C  polyethylene glycol (MIRALAX) packet Take 17 g by mouth daily. 04/02/18   Petrucelli,  Pleas Koch, PA-C    Family History Family History  Problem Relation Age of Onset  . Hypertension Father   . Diabetes Maternal Grandmother   . Alcohol abuse Maternal Grandfather   . Diabetes Maternal Grandfather   . Heart attack Paternal Grandfather   . Stroke Paternal Grandfather     Social History Social History   Tobacco Use  . Smoking status: Current Every Day Smoker    Packs/day: 1.00    Types: Cigarettes  . Smokeless tobacco: Never Used  Substance Use Topics  . Alcohol use: Not Currently    Comment: occ  . Drug use: No     Allergies   Patient has no known allergies.   Review of Systems Review of Systems  Constitutional: Positive for fatigue and fever.  HENT: Negative for sore throat.   Respiratory: Positive for cough.   Gastrointestinal: Negative for diarrhea.  Musculoskeletal: Positive for myalgias.  Neurological: Positive for headaches.  All other systems reviewed and are negative.    Physical Exam Updated Vital Signs BP (!) 141/67 (BP Location: Left Arm)   Pulse (!) 101   Temp (!) 101.5 F (38.6 C) (Oral)   Resp 20   Ht 6\' 4"  (1.93 m)   Wt 111.1 kg   SpO2 100%   BMI 29.82 kg/m   Physical Exam Vitals signs and nursing note reviewed.  Constitutional:      General: He is not in acute distress.    Appearance: He is well-developed. He is not diaphoretic.  HENT:     Head: Normocephalic and atraumatic.     Right Ear: Tympanic membrane normal.     Left Ear: Tympanic membrane normal.     Nose: No congestion.     Mouth/Throat:     Mouth: Mucous membranes are moist.     Pharynx: No oropharyngeal exudate or posterior oropharyngeal erythema.  Neck:     Musculoskeletal: Normal range of motion and neck supple.  Cardiovascular:     Rate and Rhythm: Normal rate and regular rhythm.     Heart sounds: No murmur. No friction rub.  Pulmonary:     Effort: Pulmonary effort is normal. No respiratory distress.     Breath sounds: Normal breath sounds. No  wheezing or rales.  Abdominal:     General: Bowel sounds are normal. There is no distension.     Palpations: Abdomen is soft.     Tenderness: There is no abdominal tenderness.  Musculoskeletal: Normal range of motion.  Skin:    General: Skin is warm and dry.  Neurological:     Mental Status: He is alert and oriented to person, place, and time.     Coordination: Coordination normal.      ED Treatments / Results  Labs (all labs ordered are listed, but only abnormal results are displayed) Labs Reviewed - No data to display  EKG None  Radiology No results found.  Procedures Procedures (including critical care time)  Medications Ordered in ED Medications  ketorolac (TORADOL) injection 60 mg (has no administration in time range)  acetaminophen (TYLENOL) tablet 1,000 mg (has no administration in time range)  oseltamivir (TAMIFLU) capsule 75 mg (has no administration in time range)     Initial Impression / Assessment and Plan / ED Course  I have reviewed the triage vital signs and the nursing notes.  Pertinent labs & imaging results that were available during my care of the patient were reviewed by me and considered in my medical decision making (see chart for details).  Patient's presentation most consistent with influenza.  Patient will be treated with rotating Tylenol and Motrin, and Tamiflu.  He is to follow-up as needed if not improving.  Final Clinical Impressions(s) / ED Diagnoses   Final diagnoses:  None    ED Discharge Orders    None       Geoffery Lyons, MD 06/21/18 [phone]

## 2018-06-21 NOTE — ED Triage Notes (Signed)
Pt is c/o fever, chills, itchy throat, and general body aches  Pt states his sxs started on Friday morning

## 2018-06-22 ENCOUNTER — Emergency Department (HOSPITAL_BASED_OUTPATIENT_CLINIC_OR_DEPARTMENT_OTHER)
Admission: EM | Admit: 2018-06-22 | Discharge: 2018-06-22 | Disposition: A | Discharge status: Home / Self Care | Payer: Self-pay | Attending: Emergency Medicine | Admitting: Emergency Medicine

## 2018-06-22 ENCOUNTER — Other Ambulatory Visit: Payer: Self-pay

## 2018-06-22 ENCOUNTER — Encounter (HOSPITAL_BASED_OUTPATIENT_CLINIC_OR_DEPARTMENT_OTHER): Payer: Self-pay | Admitting: Emergency Medicine

## 2018-06-22 DIAGNOSIS — R509 Fever, unspecified: Secondary | ICD-10-CM | POA: Insufficient documentation

## 2018-06-22 DIAGNOSIS — M791 Myalgia, unspecified site: Secondary | ICD-10-CM | POA: Insufficient documentation

## 2018-06-22 DIAGNOSIS — Z79899 Other long term (current) drug therapy: Secondary | ICD-10-CM | POA: Insufficient documentation

## 2018-06-22 DIAGNOSIS — R05 Cough: Secondary | ICD-10-CM | POA: Insufficient documentation

## 2018-06-22 DIAGNOSIS — F17210 Nicotine dependence, cigarettes, uncomplicated: Secondary | ICD-10-CM | POA: Insufficient documentation

## 2018-06-22 DIAGNOSIS — R1084 Generalized abdominal pain: Secondary | ICD-10-CM | POA: Insufficient documentation

## 2018-06-22 DIAGNOSIS — R112 Nausea with vomiting, unspecified: Secondary | ICD-10-CM | POA: Insufficient documentation

## 2018-06-22 LAB — COMPREHENSIVE METABOLIC PANEL
ALT: 26 U/L (ref 0–44)
ANION GAP: 9 (ref 5–15)
AST: 34 U/L (ref 15–41)
Albumin: 3.8 g/dL (ref 3.5–5.0)
Alkaline Phosphatase: 52 U/L (ref 38–126)
BUN: 9 mg/dL (ref 6–20)
CHLORIDE: 99 mmol/L (ref 98–111)
CO2: 24 mmol/L (ref 22–32)
Calcium: 8.5 mg/dL — ABNORMAL LOW (ref 8.9–10.3)
Creatinine, Ser: 1.25 mg/dL — ABNORMAL HIGH (ref 0.61–1.24)
GFR calc non Af Amer: >60 mL/min (ref >60)
Glucose, Bld: 96 mg/dL (ref 70–99)
Potassium: 3.5 mmol/L (ref 3.5–5.1)
SODIUM: 132 mmol/L — AB (ref 135–145)
Total Bilirubin: 0.4 mg/dL (ref 0.3–1.2)
Total Protein: 6.6 g/dL (ref 6.5–8.1)

## 2018-06-22 LAB — CBC WITH DIFFERENTIAL/PLATELET
ABS IMMATURE GRANULOCYTES: 0.02 10*3/uL (ref 0.00–0.07)
BASOS PCT: 1 %
Basophils Absolute: 0.1 10*3/uL (ref 0.0–0.1)
EOS PCT: 0 %
Eosinophils Absolute: 0 10*3/uL (ref 0.0–0.5)
HCT: 46.3 % (ref 39.0–52.0)
Hemoglobin: 15.8 g/dL (ref 13.0–17.0)
Immature Granulocytes: 0 %
LYMPHS ABS: 1.2 10*3/uL (ref 0.7–4.0)
Lymphocytes Relative: 19 %
MCH: 30.3 pg (ref 26.0–34.0)
MCHC: 34.1 g/dL (ref 30.0–36.0)
MCV: 88.9 fL (ref 80.0–100.0)
MONO ABS: 1.2 10*3/uL — AB (ref 0.1–1.0)
MONOS PCT: 19 %
Neutro Abs: 4 10*3/uL (ref 1.7–7.7)
Neutrophils Relative %: 61 %
PLATELETS: 194 10*3/uL (ref 150–400)
RBC: 5.21 MIL/uL (ref 4.22–5.81)
RDW: 13.5 % (ref 11.5–15.5)
WBC: 6.4 10*3/uL (ref 4.0–10.5)
nRBC: 0 % (ref 0.0–0.2)

## 2018-06-22 LAB — LIPASE, BLOOD: Lipase: 38 U/L (ref 11–51)

## 2018-06-22 MED ORDER — ONDANSETRON 4 MG PO TBDP: 4.0000 mg | ORAL_TABLET | Freq: Three times a day (TID) | ORAL | 0 refills | Status: DC | PRN

## 2018-06-22 MED ORDER — KETOROLAC TROMETHAMINE 15 MG/ML IJ SOLN
15.0000 mg | Freq: Once | INTRAMUSCULAR | Status: AC
  Administered 2018-06-22: 15 mg via INTRAVENOUS
  Filled 2018-06-22: qty 1

## 2018-06-22 MED ORDER — SODIUM CHLORIDE 0.9 % IV BOLUS
1000.0000 mL | Freq: Once | INTRAVENOUS | Status: AC
  Administered 2018-06-22: 1000 mL via INTRAVENOUS

## 2018-06-22 MED ORDER — ONDANSETRON HCL 4 MG/2ML IJ SOLN
4.0000 mg | Freq: Once | INTRAMUSCULAR | Status: AC
  Administered 2018-06-22: 4 mg via INTRAVENOUS
  Filled 2018-06-22: qty 2

## 2018-06-22 NOTE — ED Triage Notes (Signed)
Reports to ER for continued generalized body aches.  Reports vomiting.  Patient angry he received RX for tamiflu 2 days ago.  States "why would the doctor give it to me if it doesn't get rid of the flu".  Explained to patient that it may help in some cases lessen the length of the flu.  Patient states he has taken every over the counter medicine.

## 2018-06-22 NOTE — Discharge Instructions (Signed)
Please read and follow all provided instructions.  Your diagnoses today include:  1. Non-intractable vomiting with nausea, unspecified vomiting type     Tests performed today include:  Blood counts and electrolytes  Blood tests to check liver and kidney function  Blood tests to check pancreas function  Vital signs. See below for your results today.   Medications prescribed:   Zofran (ondansetron) - for nausea and vomiting  Take any prescribed medications only as directed.  Home care instructions:   Follow any educational materials contained in this packet.  Follow-up instructions: Please follow-up with your primary care provider in the next 3 days for further evaluation of your symptoms.    Return instructions:  SEEK IMMEDIATE MEDICAL ATTENTION IF:  The pain does not go away or becomes severe   A temperature above 101F develops   Repeated vomiting occurs (multiple episodes)   The pain becomes localized to portions of the abdomen. The right side could possibly be appendicitis. In an adult, the left lower portion of the abdomen could be colitis or diverticulitis.   Blood is being passed in stools or vomit (bright red or black tarry stools)   You develop chest pain, difficulty breathing, dizziness or fainting, or become confused, poorly responsive, or inconsolable (young children)  If you have any other emergent concerns regarding your health  Additional Information: Abdominal (belly) pain can be caused by many things. Your caregiver performed an examination and possibly ordered blood/urine tests and imaging (CT scan, x-rays, ultrasound). Many cases can be observed and treated at home after initial evaluation in the emergency department. Even though you are being discharged home, abdominal pain can be unpredictable. Therefore, you need a repeated exam if your pain does not resolve, returns, or worsens. Most patients with abdominal pain don't have to be admitted to the  hospital or have surgery, but serious problems like appendicitis and gallbladder attacks can start out as nonspecific pain. Many abdominal conditions cannot be diagnosed in one visit, so follow-up evaluations are very important.  Your vital signs today were: BP (!) 145/94 (BP Location: Right Arm)    Pulse 68    Temp 99.5 F (37.5 C) (Oral)    Resp 18    Ht 6\' 4"  (1.93 m)    Wt 111.1 kg    SpO2 100%    BMI 29.82 kg/m  If your blood pressure (bp) was elevated above 135/85 this visit, please have this repeated by your doctor within one month. --------------

## 2018-06-22 NOTE — ED Provider Notes (Signed)
[facility] EMERGENCY DEPARTMENT Provider Note   CSN: [id number] Arrival date & time: 06/22/18  1236     History   Chief Complaint Chief Complaint  Patient presents with  . Generalized Body Aches    HPI Joseph Simon is a 33 y.o. male.  Patient presents the emergency department with nausea, vomiting.  Patient states that he was seen in the emergency department 2 days ago (was early yesterday morning) with fever, body aches, sore throat and cough.  He states that he was diagnosed with flu and prescribed Tamiflu.  Patient started vomiting earlier in the day yesterday.  He vomited before and after taking the Tamiflu for the first time.  He has had some generalized abdominal cramping.  No diarrhea.  No urinary symptoms.  No chest pain or shortness of breath.  He denies heavy NSAID use or alcohol use.  Onset of symptoms acute.  Course is constant.     Past Medical History:  Diagnosis Date  . Back pain     Patient Active Problem List   Diagnosis Date Noted  . Chronic midline low back pain with right-sided sciatica 04/26/2016  . Chronic non-seasonal allergic rhinitis 04/26/2016    History reviewed. No pertinent surgical history.      Home Medications    Prior to Admission medications   Medication Sig Start Date End Date Taking? Authorizing Provider  Lido-Capsaicin-Men-Methyl Sal (1ST MEDX-PATCH/ LIDOCAINE) 4-0.025-5-20 % PTCH Apply 1 patch topically daily. 04/20/18   Rolan Bucco, MD  meloxicam (MOBIC) 15 MG tablet Take 1 tablet (15 mg total) by mouth daily. 04/20/18   Rolan Bucco, MD  methylPREDNISolone (MEDROL DOSEPAK) 4 MG TBPK tablet Use as directed 03/17/18   Arthor Captain, PA-C  ondansetron (ZOFRAN ODT) 4 MG disintegrating tablet Take 1 tablet (4 mg total) by mouth every 8 (eight) hours as needed for nausea or vomiting. 04/02/18   Petrucelli, Pleas Koch, PA-C  oseltamivir (TAMIFLU) 75 MG capsule Take 1 capsule (75 mg total) by mouth every 12 (twelve)  hours. 06/21/18   Geoffery Lyons, MD  polyethylene glycol [facility]) packet Take 17 g by mouth daily. 04/02/18   Petrucelli, Pleas Koch, PA-C    Family History Family History  Problem Relation Age of Onset  . Hypertension Father   . Diabetes Maternal Grandmother   . Alcohol abuse Maternal Grandfather   . Diabetes Maternal Grandfather   . Heart attack Paternal Grandfather   . Stroke Paternal Grandfather     Social History Social History   Tobacco Use  . Smoking status: Current Every Day Smoker    Packs/day: 1.00    Types: Cigarettes  . Smokeless tobacco: Never Used  Substance Use Topics  . Alcohol use: Not Currently    Comment: occ  . Drug use: No     Allergies   Patient has no known allergies.   Review of Systems Review of Systems  Constitutional: Positive for chills and fever. Negative for fatigue.  HENT: Negative for congestion, ear pain, rhinorrhea, sinus pressure and sore throat.   Eyes: Negative for redness.  Respiratory: Positive for cough. Negative for wheezing.   Gastrointestinal: Positive for abdominal pain (cramps), nausea and vomiting. Negative for diarrhea.  Genitourinary: Negative for dysuria.  Musculoskeletal: Positive for myalgias. Negative for neck stiffness.  Skin: Negative for rash.  Neurological: Negative for headaches.  Hematological: Negative for adenopathy.     Physical Exam Updated Vital Signs BP (!) 132/97 (BP Location: Left Arm)   Pulse 76  Temp (!) 100.4 F (38 C) (Oral)   Resp 20   Ht 6\' 4"  (1.93 m)   Wt 111.1 kg   SpO2 100%   BMI 29.82 kg/m   Physical Exam Vitals signs and nursing note reviewed.  Constitutional:      Appearance: He is well-developed.  HENT:     Head: Normocephalic and atraumatic.     Nose: No congestion or rhinorrhea.  Eyes:     General:        Right eye: No discharge.        Left eye: No discharge.     Conjunctiva/sclera: Conjunctivae normal.  Neck:     Musculoskeletal: Normal range of motion and  neck supple.  Cardiovascular:     Rate and Rhythm: Normal rate and regular rhythm.     Heart sounds: Normal heart sounds.  Pulmonary:     Effort: Pulmonary effort is normal.     Breath sounds: Normal breath sounds.  Abdominal:     Palpations: Abdomen is soft.     Tenderness: There is no abdominal tenderness. There is no guarding or rebound.     Comments: Active vomiting upon arriving in exam room.   Skin:    General: Skin is warm and dry.  Neurological:     Mental Status: He is alert.      ED Treatments / Results  Labs (all labs ordered are listed, but only abnormal results are displayed) Labs Reviewed  CBC WITH DIFFERENTIAL/PLATELET - Abnormal; Notable for the following components:      Result Value   Monocytes Absolute 1.2 (*)    All other components within normal limits  COMPREHENSIVE METABOLIC PANEL - Abnormal; Notable for the following components:   Sodium 132 (*)    Creatinine, Ser 1.25 (*)    Calcium 8.5 (*)    All other components within normal limits  LIPASE, BLOOD    EKG None  Radiology No results found.  Procedures Procedures (including critical care time)  Medications Ordered in ED Medications  sodium chloride 0.9 % bolus 1,000 mL (0 mLs Intravenous Stopped 06/22/18 1450)  ondansetron (ZOFRAN) injection 4 mg (4 mg Intravenous Given 06/22/18 1401)  ketorolac (TORADOL) 15 MG/ML injection 15 mg (15 mg Intravenous Given 06/22/18 1400)     Initial Impression / Assessment and Plan / ED Course  I have reviewed the triage vital signs and the nursing notes.  Pertinent labs & imaging results that were available during my care of the patient were reviewed by me and considered in my medical decision making (see chart for details).     Patient seen and examined.  Given active vomiting, will treat with Zofran, IV fluids.  Will check basic lab work.  Vital signs reviewed and are as follows: BP (!) 132/97 (BP Location: Left Arm)   Pulse 76   Temp (!) 100.4 F  (38 C) (Oral)   Resp 20   Ht 6\' 4"  (1.93 m)   Wt 111.1 kg   SpO2 100%   BMI 29.82 kg/m   3:57 PM patient is tolerated oral fluid challenge.  He is feeling better.  We will discharged home with prescription for Zofran.  Abdomen remained soft and nontender.  The patient was urged to return to the Emergency Department immediately with worsening of current symptoms, worsening abdominal pain, persistent vomiting, blood noted in stools, fever, or any other concerns. The patient verbalized understanding.    Final Clinical Impressions(s) / ED Diagnoses   Final diagnoses:  Non-intractable vomiting with nausea, unspecified vomiting type   Patient with nausea and vomiting in setting of recent influenza-like illness.  Symptoms may have been exacerbated by use of Tamiflu.  Lab work is reassuring.  No focal abdominal pain on exam.  Symptoms controlled in the emergency department and patient appears well.  No indication for advanced imaging at this time.  Return instructions as above.  ED Discharge Orders         Ordered    ondansetron (ZOFRAN ODT) 4 MG disintegrating tablet  Every 8 hours PRN     06/22/18 1556           Renne Crigler, PA-C 06/22/18 1559    Gwyneth Sprout, MD 06/27/18 1455

## 2019-03-04 ENCOUNTER — Other Ambulatory Visit: Payer: Self-pay

## 2019-03-04 DIAGNOSIS — Z20822 Contact with and (suspected) exposure to covid-19: Secondary | ICD-10-CM

## 2019-03-05 LAB — NOVEL CORONAVIRUS, NAA: SARS-CoV-2, NAA: NOT DETECTED

## 2019-11-07 ENCOUNTER — Emergency Department (HOSPITAL_BASED_OUTPATIENT_CLINIC_OR_DEPARTMENT_OTHER)
Admission: EM | Admit: 2019-11-07 | Discharge: 2019-11-07 | Disposition: A | Discharge status: Home / Self Care | Payer: Self-pay | Attending: Emergency Medicine | Admitting: Emergency Medicine

## 2019-11-07 ENCOUNTER — Encounter (HOSPITAL_BASED_OUTPATIENT_CLINIC_OR_DEPARTMENT_OTHER): Payer: Self-pay | Admitting: Emergency Medicine

## 2019-11-07 ENCOUNTER — Other Ambulatory Visit: Payer: Self-pay

## 2019-11-07 ENCOUNTER — Emergency Department (HOSPITAL_BASED_OUTPATIENT_CLINIC_OR_DEPARTMENT_OTHER): Payer: Self-pay

## 2019-11-07 DIAGNOSIS — Y999 Unspecified external cause status: Secondary | ICD-10-CM | POA: Insufficient documentation

## 2019-11-07 DIAGNOSIS — Y939 Activity, unspecified: Secondary | ICD-10-CM | POA: Insufficient documentation

## 2019-11-07 DIAGNOSIS — F17210 Nicotine dependence, cigarettes, uncomplicated: Secondary | ICD-10-CM | POA: Insufficient documentation

## 2019-11-07 DIAGNOSIS — W228XXA Striking against or struck by other objects, initial encounter: Secondary | ICD-10-CM | POA: Insufficient documentation

## 2019-11-07 DIAGNOSIS — S60511A Abrasion of right hand, initial encounter: Secondary | ICD-10-CM | POA: Insufficient documentation

## 2019-11-07 DIAGNOSIS — Y929 Unspecified place or not applicable: Secondary | ICD-10-CM | POA: Insufficient documentation

## 2019-11-07 DIAGNOSIS — S62141A Displaced fracture of body of hamate [unciform] bone, right wrist, initial encounter for closed fracture: Secondary | ICD-10-CM | POA: Insufficient documentation

## 2019-11-07 DIAGNOSIS — T148XXA Other injury of unspecified body region, initial encounter: Secondary | ICD-10-CM

## 2019-11-07 DIAGNOSIS — Z23 Encounter for immunization: Secondary | ICD-10-CM | POA: Insufficient documentation

## 2019-11-07 DIAGNOSIS — S62306A Unspecified fracture of fifth metacarpal bone, right hand, initial encounter for closed fracture: Secondary | ICD-10-CM | POA: Insufficient documentation

## 2019-11-07 MED ORDER — OXYCODONE HCL 5 MG PO TABS: 5.0000 mg | ORAL_TABLET | ORAL | 0 refills | Status: DC | PRN

## 2019-11-07 MED ORDER — ACETAMINOPHEN 500 MG PO TABS
1000.0000 mg | ORAL_TABLET | Freq: Once | ORAL | Status: AC
  Administered 2019-11-07: 1000 mg via ORAL
  Filled 2019-11-07: qty 2

## 2019-11-07 MED ORDER — TETANUS-DIPHTH-ACELL PERTUSSIS 5-2.5-18.5 LF-MCG/0.5 IM SUSP
0.5000 mL | Freq: Once | INTRAMUSCULAR | Status: AC
  Administered 2019-11-07: 0.5 mL via INTRAMUSCULAR
  Filled 2019-11-07: qty 0.5

## 2019-11-07 NOTE — ED Triage Notes (Signed)
R hand pain after punching a wall today.

## 2019-11-07 NOTE — Discharge Instructions (Addendum)
You have a small chip fracture of your fifth metacarpal OR hamate bone. We recommend calling the hand surgery doctor above for follow up in one-two weeks.   Take Tylenol 1000 mg 4 times a day for 1 week. This is the maximum dose of Tylenol (acetaminophen) usually take from all sources. Please check other over-the-counter medications and prescriptions to ensure you are not taking other medications that contain acetaminophen.  You may also take ibuprofen 400 mg 6 times a day alternating with or at the same time as tylenol.  Take oxycodone as needed for breakthrough pain.  This medication can be addicting, sedating and cause constipation.
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
## 2020-10-18 ENCOUNTER — Encounter (HOSPITAL_BASED_OUTPATIENT_CLINIC_OR_DEPARTMENT_OTHER): Payer: Self-pay | Admitting: *Deleted

## 2020-10-18 ENCOUNTER — Emergency Department (HOSPITAL_BASED_OUTPATIENT_CLINIC_OR_DEPARTMENT_OTHER): Payer: 59

## 2020-10-18 ENCOUNTER — Emergency Department (HOSPITAL_BASED_OUTPATIENT_CLINIC_OR_DEPARTMENT_OTHER)
Admission: EM | Admit: 2020-10-18 | Discharge: 2020-10-18 | Disposition: A | Discharge status: Home / Self Care | Payer: 59 | Attending: Emergency Medicine | Admitting: Emergency Medicine

## 2020-10-18 ENCOUNTER — Other Ambulatory Visit: Payer: Self-pay

## 2020-10-18 DIAGNOSIS — X509XXA Other and unspecified overexertion or strenuous movements or postures, initial encounter: Secondary | ICD-10-CM | POA: Diagnosis not present

## 2020-10-18 DIAGNOSIS — S8992XA Unspecified injury of left lower leg, initial encounter: Secondary | ICD-10-CM | POA: Diagnosis not present

## 2020-10-18 DIAGNOSIS — F17210 Nicotine dependence, cigarettes, uncomplicated: Secondary | ICD-10-CM | POA: Diagnosis not present

## 2020-10-18 DIAGNOSIS — Y9367 Activity, basketball: Secondary | ICD-10-CM | POA: Diagnosis not present

## 2020-10-18 DIAGNOSIS — M79662 Pain in left lower leg: Secondary | ICD-10-CM | POA: Insufficient documentation

## 2020-10-18 NOTE — Discharge Instructions (Addendum)
I recommend a combination of tylenol and ibuprofen for management of your pain. You can take a low dose of both at the same time. I recommend 500 mg of Tylenol combined with 600 mg of ibuprofen. This is one maximum strength Tylenol and three regular ibuprofen. You can take these 2-3 times for day for your pain. Please try to take these medications with a small amount of food as well to prevent upsetting your stomach.  Also, please consider topical pain relieving creams such as Voltaran Gel, BioFreeze, or Icy Hot. There is also a pain relieving cream made by Aleve. You should be able to find all of these at your local pharmacy.   Please work to not bear weight with the left leg until you have had your calf reevaluated by orthopedics.  Below is the contact information for [facility].  Please give them a call later today to schedule that follow-up appointment.  At the end of this paperwork is also a work note for the next 2 days.  If you develop any new or worsening symptoms please come back to the emergency department.  It was a pleasure to meet you.

## 2020-10-18 NOTE — ED Provider Notes (Addendum)
[facility] EMERGENCY DEPARTMENT Provider Note   CSN: [id number] Arrival date & time: 10/18/20  1205     History Chief Complaint  Patient presents with   Leg Pain    Joseph Simon is a 35 y.o. male.  HPI Patient is a 35 year old male with a medical history as noted below.  He presents to the emergency department due to left calf pain.  He states he was playing basketball yesterday jumped and when landing felt a pop in the calf and immediate pain.  Pain worsens significantly when standing and due to this denies the ability to bear weight and ambulate with the left leg.  Pain is nonradiating.  Denies any knee or ankle pain.  No numbness.    Past Medical History:  Diagnosis Date   Back pain     Patient Active Problem List   Diagnosis Date Noted   Chronic midline low back pain with right-sided sciatica 04/26/2016   Chronic non-seasonal allergic rhinitis 04/26/2016    History reviewed. No pertinent surgical history.     Family History  Problem Relation Age of Onset   Hypertension Father    Diabetes Maternal Grandmother    Alcohol abuse Maternal Grandfather    Diabetes Maternal Grandfather    Heart attack Paternal Grandfather    Stroke Paternal Grandfather     Social History   Tobacco Use   Smoking status: Every Day    Packs/day: 1.00    Pack years: 0.00    Types: Cigarettes   Smokeless tobacco: Never  Vaping Use   Vaping Use: Never used  Substance Use Topics   Alcohol use: Yes    Comment: occ   Drug use: No    Home Medications Prior to Admission medications   Medication Sig Start Date End Date Taking? Authorizing Provider  Lido-Capsaicin-Men-Methyl Sal (1ST MEDX-PATCH/ LIDOCAINE) 4-0.025-5-20 % PTCH Apply 1 patch topically daily. 04/20/18   Rolan Bucco, MD  meloxicam (MOBIC) 15 MG tablet Take 1 tablet (15 mg total) by mouth daily. 04/20/18   Rolan Bucco, MD  methylPREDNISolone (MEDROL DOSEPAK) 4 MG TBPK tablet Use as directed 03/17/18    Arthor Captain, PA-C  ondansetron (ZOFRAN ODT) 4 MG disintegrating tablet Take 1 tablet (4 mg total) by mouth every 8 (eight) hours as needed for nausea or vomiting. 06/22/18   Renne Crigler, PA-C  oxyCODONE (ROXICODONE) 5 MG immediate release tablet Take 1 tablet (5 mg total) by mouth every 4 (four) hours as needed for severe pain. 11/07/19   Alvira Monday, MD  polyethylene glycol [facility]) packet Take 17 g by mouth daily. 04/02/18   Petrucelli, Pleas Koch, PA-C    Allergies    Patient has no known allergies.  Review of Systems   Review of Systems  Musculoskeletal:  Positive for myalgias.  Skin:  Negative for color change and wound.  Neurological:  Negative for weakness and numbness.   Physical Exam Updated Vital Signs BP (!) 162/115 (BP Location: Left Arm)   Pulse 90   Temp 98.5 F (36.9 C) (Oral)   Resp 18   Ht 6\' 4"  (1.93 m)   Wt 102.1 kg   SpO2 99%   BMI 27.39 kg/m   Physical Exam Vitals and nursing note reviewed.  Constitutional:      General: He is not in acute distress.    Appearance: Normal appearance. He is not ill-appearing, toxic-appearing or diaphoretic.  HENT:     Head: Normocephalic and atraumatic.     Right Ear:  External ear normal.     Left Ear: External ear normal.     Nose: Nose normal.     Mouth/Throat:     Mouth: Mucous membranes are moist.     Pharynx: Oropharynx is clear. No oropharyngeal exudate or posterior oropharyngeal erythema.  Eyes:     General: No scleral icterus.       Right eye: No discharge.        Left eye: No discharge.     Extraocular Movements: Extraocular movements intact.     Conjunctiva/sclera: Conjunctivae normal.  Cardiovascular:     Rate and Rhythm: Normal rate.     Pulses: Normal pulses.  Pulmonary:     Effort: Pulmonary effort is normal.  Abdominal:     General: Abdomen is flat. There is no distension.  Musculoskeletal:        General: Tenderness present. Normal range of motion.     Cervical back: Normal range of  motion and neck supple. No tenderness.     Comments: Moderate TTP with a 7 to 8 cm region of swelling to the left medial calf.  Achilles tendon appears intact.  Patient able to plantarflex and dorsiflex against resistance.  Negative Thompson's test.  Distal sensation intact.  2+ DP pulses.  Full range of motion of the left ankle and the left knee.  No tenderness appreciated in the left knee or ankle.  Skin:    General: Skin is warm and dry.  Neurological:     General: No focal deficit present.     Mental Status: He is alert and oriented to person, place, and time.  Psychiatric:        Mood and Affect: Mood normal.        Behavior: Behavior normal.    ED Results / Procedures / Treatments   Labs (all labs ordered are listed, but only abnormal results are displayed) Labs Reviewed - No data to display  EKG None  Radiology DG Tibia/Fibula Left  Result Date: 10/18/2020 CLINICAL DATA:  Left calf pain since basketball injury yesterday EXAM: LEFT TIBIA AND FIBULA - 2 VIEW COMPARISON:  None. FINDINGS: No fracture. No focal osseous lesions. No malalignment at the left knee or left ankle on these views. Mild degenerative changes in the dorsal talonavicular joint. No radiopaque foreign bodies. IMPRESSION: No fracture. Mild degenerative changes in the dorsal talonavicular joint. Electronically Signed   By: Delbert Phenix M.D.   On: 10/18/2020 13:10    Procedures Procedures   Medications Ordered in ED Medications - No data to display  ED Course  I have reviewed the triage vital signs and the nursing notes.  Pertinent labs & imaging results that were available during my care of the patient were reviewed by me and considered in my medical decision making (see chart for details).    MDM Rules/Calculators/A&P                          Pt is a 35 y.o. male who presents to the emergency department due to a left calf injury.  Imaging: X-ray of the left tibia/fibula shows no fracture.  Mild  degenerative changes in the dorsal talar navicular joint.  I, Placido Sou, PA-C, personally reviewed and evaluated these images and lab results as part of my medical decision-making.  Patient likely tore a muscle in the left calf yesterday while playing basketball.  Achilles tendon appears intact and patient able to plantarflex against resistance.  Will  place patient in a cam boot and give crutches.  Recommended that he stay nonweightbearing.  He states he has been evaluated by [facility] in the past.  Recommended that he call them later today to schedule follow-up appointment for reevaluation.  He was given their contact information.  Recommended continued use of Tylenol/ibuprofen for management of his pain.  We discussed dosing.  Discussed topical pain relieving creams.  Discussed RICE method.  Patient is stable for discharge at this time and he is agreeable.  Given strict return precautions.  His questions were answered and he was amicable at the time of discharge.  Note: Portions of this report may have been transcribed using voice recognition software. Every effort was made to ensure accuracy; however, inadvertent computerized transcription errors may be present.   Final Clinical Impression(s) / ED Diagnoses Final diagnoses:  Pain of left calf   Rx / DC Orders ED Discharge Orders     None        Placido Sou, PA-C 10/18/20 1321    Placido Sou, PA-C 10/18/20 1322    Virgina Norfolk, DO 10/18/20 1407

## 2020-10-18 NOTE — ED Triage Notes (Signed)
Pain in his left calf since playing basketball yesterday. He jumped and felt a pop. Pain since that time.

## 2020-11-01 ENCOUNTER — Ambulatory Visit (INDEPENDENT_AMBULATORY_CARE_PROVIDER_SITE_OTHER): Payer: 59 | Admitting: Family Medicine

## 2020-11-01 ENCOUNTER — Encounter: Payer: Self-pay | Admitting: Family Medicine

## 2020-11-01 ENCOUNTER — Other Ambulatory Visit: Payer: Self-pay

## 2020-11-01 VITALS — BP 134/80 | Ht 76.0 in | Wt 225.0 lb

## 2020-11-01 DIAGNOSIS — S86812A Strain of other muscle(s) and tendon(s) at lower leg level, left leg, initial encounter: Secondary | ICD-10-CM | POA: Diagnosis not present

## 2020-11-01 NOTE — Progress Notes (Signed)
PCP: Patient, No Pcp Per (Inactive)  Subjective:   HPI: Patient is a 35 y.o. male here for left calf injury.  Patient reports that on 6/13 he was playing basketball. He jumped up and came down, when he landed he felt a hard pop in the medial aspect of his left calf. He could not bear weight initially. He started to use crutches and went to emergency department the next day. He had been diagnosed with a calf sprain with associated bruising and swelling. He has not been taking any medications and denies any prior injury. He works for Public affairs consultant at [employer]. He has been elevating this leg. Has had slow improvement since the injury.  Past Medical History:  Diagnosis Date   Back pain     No current outpatient medications on file prior to visit.   No current facility-administered medications on file prior to visit.    History reviewed. No pertinent surgical history.  No Known Allergies  Social History   Socioeconomic History   Marital status: Single    Spouse name: Not on file   Number of children: Not on file   Years of education: Not on file   Highest education level: Not on file  Occupational History   Not on file  Tobacco Use   Smoking status: Every Day    Packs/day: 1.00    Pack years: 0.00    Types: Cigarettes   Smokeless tobacco: Never  Vaping Use   Vaping Use: Never used  Substance and Sexual Activity   Alcohol use: Yes    Comment: occ   Drug use: No   Sexual activity: Not on file  Other Topics Concern   Not on file  Social History Narrative   Not on file   Social Determinants of Health   Financial Resource Strain: Not on file  Food Insecurity: Not on file  Transportation Needs: Not on file  Physical Activity: Not on file  Stress: Not on file  Social Connections: Not on file  Intimate Partner Violence: Not on file    Family History  Problem Relation Age of Onset   Hypertension Father    Diabetes Maternal Grandmother    Alcohol abuse  Maternal Grandfather    Diabetes Maternal Grandfather    Heart attack Paternal Grandfather    Stroke Paternal Grandfather     BP 134/80   Ht 6\' 4"  (1.93 m)   Wt 225 lb (102.1 kg)   BMI 27.39 kg/m   No flowsheet data found.  No flowsheet data found.  Review of Systems: See HPI above.     Objective:  Physical Exam:  Gen: NAD, comfortable in exam room  Left ankle/lower leg: No gross deformity, swelling, bruising. Tenderness to palpation medial gastrocnemius.  No tenderness elsewhere about ankle or lower leg including Achilles. Decreased range of motion with plantarflexion and dorsiflexion of the ankle. Able to resist plantar flexion and dorsiflexion. Negative Thompson's. Negative syndesmosis compression. Neurovascular intact distally.  Limited musculoskeletal ultrasound left lower leg: Tear visualized midportion of medial gastroc where this comes into the fascial plane.   Assessment & Plan:  1.  Left calf strain: He is about 2 weeks out from this injury.  He is clinically improving but expect this to take 2-4 more weeks.  He will use compression, ice or heat at this point.  Start range of motion exercises twice a day and Thera-Band strengthening.  Hopefully can advance to calf raises over the next couple weeks.  He will  be placed on restrictions with a note provided for work.  He will follow-up in 2 weeks.  Tylenol or Aleve if needed.

## 2020-11-01 NOTE — Patient Instructions (Signed)
You have a calf strain Compression sleeve or ace wrap to help with swelling and pain if tolerated. Icing (or heat at this point) for 15 minutes at a time 3-4 times a day Consider heel lifts either in temporary orthotics or on their own to prevent further strain. Tylenol and/or aleve for pain. Start ankle range of motion exercises twice a day (up/down and alphabet exercises). When tolerated start theraband strengthening exercises as directed. Follow up with me in 2 weeks.

## 2020-11-16 ENCOUNTER — Ambulatory Visit: Payer: 59 | Admitting: Family Medicine

## 2020-11-21 ENCOUNTER — Ambulatory Visit: Payer: 59 | Admitting: Family Medicine
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
## 2020-12-24 ENCOUNTER — Telehealth (INDEPENDENT_AMBULATORY_CARE_PROVIDER_SITE_OTHER): Payer: 59 | Admitting: Psychiatry

## 2020-12-24 ENCOUNTER — Encounter (HOSPITAL_COMMUNITY): Payer: Self-pay | Admitting: Psychiatry

## 2020-12-24 ENCOUNTER — Other Ambulatory Visit: Payer: Self-pay

## 2020-12-24 DIAGNOSIS — G4700 Insomnia, unspecified: Secondary | ICD-10-CM

## 2020-12-24 DIAGNOSIS — F321 Major depressive disorder, single episode, moderate: Secondary | ICD-10-CM | POA: Diagnosis not present

## 2020-12-24 DIAGNOSIS — R41840 Attention and concentration deficit: Secondary | ICD-10-CM | POA: Diagnosis not present

## 2020-12-24 NOTE — Patient Instructions (Addendum)
Will plan to start bupropion 150 mg daily if you have not tried this medication Referral to neuropsychological testing Referral for sleep evaluation Obtain record from your primary care provider Next appointment- 10/11 at 9:30, video  Please call the following numbers if needed To schedule/cancel an appointment: [phone] To speak with nurse (about medication, forms or other concerns): [phone]

## 2021-01-04 ENCOUNTER — Institutional Professional Consult (permissible substitution): Payer: 59 | Admitting: Neurology

## 2021-02-10 NOTE — Progress Notes (Signed)
Virtual Visit via Video Note  I connected with Joseph Simon on 02/14/21 at  9:30 AM EDT by a video enabled telemedicine application and verified that I am speaking with the correct person using two identifiers.  Location: Patient: home Provider: office Persons participated in the visit- patient, provider    I discussed the limitations of evaluation and management by telemedicine and the availability of in person appointments. The patient expressed understanding and agreed to proceed.    I discussed the assessment and treatment plan with the patient. The patient was provided an opportunity to ask questions and all were answered. The patient agreed with the plan and demonstrated an understanding of the instructions.   The patient was advised to call back or seek an in-person evaluation if the symptoms worsen or if the condition fails to improve as anticipated.  I provided 16 minutes of non-face-to-face time during this encounter.   Neysa Hotter, MD    [facility] MD/PA/NP OP Progress Note  02/14/2021 10:01 AM Joseph Simon  MRN:  [id number]  Chief Complaint:  Chief Complaint   Follow-up; Depression    HPI:  This is a follow-up appointment for depression and intention.  He states that his stress level is high now that he moves out from his parents house.  However, he likes it so for as he has more independence.  He reports good relationship with his girlfriend.  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  Although the job is good, he has been busy.  He works 7 days a week due to inflation, and he has no time to balance work and life.  He has been trying to catch up bills from the previous house.  It has been difficult for him to unwind, which affects his sleep.  He feels fatigue.  He has depressive symptoms as in PHQ-9.  He tends to be irritable.  He continues to struggle with focus.  He is unable to complete tasks, and tends to get procrastinate.  He was not aware of the phone call from  psychology clinic, although he is willing to try contacting them.  He feels anxious sometimes.  He denies panic attacks.  He gained 10 pounds over the past 6 months due to increase in appetite.  He drinks a beer, 3 times last week.  He has not used marijuana for the past 2 months. He states that he has not tried any medication other than the one for ADHD. He is willing to try bupropion this time.   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 Used to work at [employer] (stressful). Longest work for one year Support: Household: parents Marital status: single. He has a girlfriend for seven months Number of children:  0   Visit Diagnosis:    ICD-10-CM   1. Current moderate episode of major depressive disorder without prior episode (HCC)  F32.1     2. Inattention  R41.840       Past Psychiatric History: Please see initial evaluation for full details. I have reviewed the history. No updates at this time.     Past Medical History:  Past Medical History:  Diagnosis Date   Back pain    No past surgical history on file.  Family Psychiatric History: Please see initial evaluation for full details. I have reviewed the history. No updates at this time.     Family History:  Family History  Problem Relation Age of Onset   ADD / ADHD Father    Hypertension Father    Alcohol abuse Maternal Grandfather    Diabetes Maternal Grandfather    Diabetes Maternal Grandmother    Heart attack Paternal Grandfather    Stroke Paternal Grandfather     Social History:  Social History   Socioeconomic History   Marital status: Single    Spouse name: Not on file   Number of children: Not on file   Years of education: Not on file   Highest education level: Not on file  Occupational History   Not on file  Tobacco Use   Smoking status: Every Day    Packs/day: 1.00    Types:  Cigarettes   Smokeless tobacco: Never  Vaping Use   Vaping Use: Never used  Substance and Sexual Activity   Alcohol use: Yes    Comment: occ   Drug use: No   Sexual activity: Not on file  Other Topics Concern   Not on file  Social History Narrative   Not on file   Social Determinants of Health   Financial Resource Strain: Not on file  Food Insecurity: Not on file  Transportation Needs: Not on file  Physical Activity: Not on file  Stress: Not on file  Social Connections: Not on file    Allergies: No Known Allergies  Metabolic Disorder Labs: No results found for: HGBA1C, MPG No results found for: PROLACTIN No results found for: CHOL, TRIG, HDL, CHOLHDL, VLDL, LDLCALC No results found for: TSH  Therapeutic Level Labs: No results found for: LITHIUM No results found for: VALPROATE No components found for:  CBMZ  Current Medications: Current Outpatient Medications  Medication Sig Dispense Refill   buPROPion (WELLBUTRIN XL) 150 MG 24 hr tablet Take 1 tablet (150 mg total) by mouth daily. 30 tablet 1   No current facility-administered medications for this visit.     Musculoskeletal: Strength & Muscle Tone:  N/A Gait & Station:  N/A Patient leans: N/A  Psychiatric Specialty Exam: Review of Systems  Psychiatric/Behavioral:  Positive for decreased concentration, dysphoric mood and sleep disturbance. Negative for agitation, behavioral problems, confusion, hallucinations, self-injury and suicidal ideas. The patient is nervous/anxious. The patient is not hyperactive.   All other systems reviewed and are negative.  There were no vitals taken for this visit.There is no height or weight on file to calculate BMI.  General Appearance: Fairly Groomed  Eye Contact:  Good  Speech:  Clear and Coherent  Volume:  Normal  Mood:  Depressed  Affect:  Appropriate, Congruent, and down  Thought Process:  Coherent  Orientation:  Full (Time, Place, and Person)  Thought Content: Logical    Suicidal Thoughts:  No  Homicidal Thoughts:  No  Memory:  Immediate;   Good  Judgement:  Good  Insight:  Good  Psychomotor Activity:  Normal  Concentration:  Concentration: Good and Attention Span: Good  Recall:  Good  Fund of Knowledge: Good  Language: Good  Akathisia:  No  Handed:  Right  AIMS (if indicated): not done  Assets:  Communication Skills Desire for Improvement  ADL's:  Intact  Cognition: WNL  Sleep:  Poor   Screenings: PHQ2-9    Flowsheet Row Video Visit [facility]14[facility]21 [facility] Video Visit from 12/24/2020 [facility]  PHQ-2 Total Score 6 2  PHQ-9 Total Score 18 16      Flowsheet Row ED from 10/18/2020  in [facility]  C-SSRS RISK CATEGORY No Risk        Assessment and Plan:  Joseph Simon is a 35 y.o. year old male with a history of depression, marijuana use, who presents for follow up appointment for below.    1. Current moderate episode of major depressive disorder without prior episode [facility]) He continues to report depressive symptoms since the last visit.  Psychosocial stressors include financial strain, and managing 2 jobs.  He is now amenable to try bupropion to target depression and inattention.  Discussed potential risk of headache.  He has no known history of seizure.   2. Inattention Unchanged and he continues to struggle with inattention. He reports history of ADHD since 24-year-old.  Etiology of inattention is multifactorial given his mood symptoms and insomnia.  Noted that he used to use marijuana as well.  Referral was made for evaluation of ADHD.  He agrees to contact the clinic to make an appointment.    # Marijuana use He has been abstinent from marijuana use since the last visit, although he used to use it daily.  Will continue motivational interview.   # Insomnia He reports snoring, significant daytime fatigue and insomnia.   Referral was made for evaluation for sleep apnea, and he no showed.    Plan Start bupropion 150 mg daily  Referred to neuropsychological testing  Obtain record from his previous PCP (seen six months ago) Next appointment- 11/22 at 10 AM, video Obtain TSH if that is not done by his PCP   The patient demonstrates the following risk factors for suicide: Chronic risk factors for suicide include: psychiatric disorder of depression . Acute risk factors for suicide include: loss (financial, interpersonal, professional). Protective factors for this patient include: positive social support. Considering these factors, the overall suicide risk at this point appears to be low. Patient is appropriate for outpatient follow up.   Neysa Hotter, MD 02/14/2021, 10:01 AM

## 2021-02-14 ENCOUNTER — Encounter: Payer: Self-pay | Admitting: Psychiatry

## 2021-02-14 ENCOUNTER — Other Ambulatory Visit: Payer: Self-pay

## 2021-02-14 ENCOUNTER — Telehealth (INDEPENDENT_AMBULATORY_CARE_PROVIDER_SITE_OTHER): Payer: 59 | Admitting: Psychiatry

## 2021-02-14 DIAGNOSIS — R41840 Attention and concentration deficit: Secondary | ICD-10-CM

## 2021-02-14 DIAGNOSIS — F321 Major depressive disorder, single episode, moderate: Secondary | ICD-10-CM

## 2021-02-14 MED ORDER — BUPROPION HCL ER (XL) 150 MG PO TB24: 150.0000 mg | ORAL_TABLET | Freq: Every day | ORAL | 1 refills | Status: AC

## 2021-02-14 NOTE — Patient Instructions (Signed)
Start bupropion 150 mg daily  Referred to neuropsychological testing  Obtain record from your previous PCP  Next appointment- 11/22 at 10 AM

## 2021-03-25 NOTE — Progress Notes (Deleted)
BH MD/PA/NP OP Progress Note  03/25/2021 4:54 AM Joseph Simon  MRN:  [id number]  Chief Complaint:  HPI: *** Visit Diagnosis: No diagnosis found.  Past Psychiatric History: Please see initial evaluation for full details. I have reviewed the history. No updates at this time.     Past Medical History:  Past Medical History:  Diagnosis Date   Back pain    No past surgical history on file.  Family Psychiatric History: Please see initial evaluation for full details. I have reviewed the history. No updates at this time.     Family History:  Family History  Problem Relation Age of Onset   ADD / ADHD Father    Hypertension Father    Alcohol abuse Maternal Grandfather    Diabetes Maternal Grandfather    Diabetes Maternal Grandmother    Heart attack Paternal Grandfather    Stroke Paternal Grandfather     Social History:  Social History   Socioeconomic History   Marital status: Single    Spouse name: Not on file   Number of children: Not on file   Years of education: Not on file   Highest education level: Not on file  Occupational History   Not on file  Tobacco Use   Smoking status: Every Day    Packs/day: 1.00    Types: Cigarettes   Smokeless tobacco: Never  Vaping Use   Vaping Use: Never used  Substance and Sexual Activity   Alcohol use: Yes    Comment: occ   Drug use: No   Sexual activity: Not on file  Other Topics Concern   Not on file  Social History Narrative   Not on file   Social Determinants of Health   Financial Resource Strain: Not on file  Food Insecurity: Not on file  Transportation Needs: Not on file  Physical Activity: Not on file  Stress: Not on file  Social Connections: Not on file    Allergies: No Known Allergies  Metabolic Disorder Labs: No results found for: HGBA1C, MPG No results found for: PROLACTIN No results found for: CHOL, TRIG, HDL, CHOLHDL, VLDL, LDLCALC No results found for: TSH  Therapeutic Level Labs: No results  found for: LITHIUM No results found for: VALPROATE No components found for:  CBMZ  Current Medications: Current Outpatient Medications  Medication Sig Dispense Refill   buPROPion (WELLBUTRIN XL) 150 MG 24 hr tablet Take 1 tablet (150 mg total) by mouth daily. 30 tablet 1   No current facility-administered medications for this visit.     Musculoskeletal: Strength & Muscle Tone:  N/A Gait & Station:  N/A Patient leans: N/A  Psychiatric Specialty Exam: Review of Systems  There were no vitals taken for this visit.There is no height or weight on file to calculate BMI.  General Appearance: {Appearance:22683}  Eye Contact:  {BHH EYE CONTACT:22684}  Speech:  Clear and Coherent  Volume:  Normal  Mood:  {BHH MOOD:22306}  Affect:  {Affect (PAA):22687}  Thought Process:  Coherent  Orientation:  Full (Time, Place, and Person)  Thought Content: Logical   Suicidal Thoughts:  {ST/HT (PAA):22692}  Homicidal Thoughts:  {ST/HT (PAA):22692}  Memory:  Immediate;   Good  Judgement:  {Judgement (PAA):22694}  Insight:  {Insight (PAA):22695}  Psychomotor Activity:  Normal  Concentration:  Concentration: Good and Attention Span: Good  Recall:  Good  Fund of Knowledge: Good  Language: Good  Akathisia:  No  Handed:  Right  AIMS (if indicated): not done  Assets:  Communication  Skills Desire for Improvement  ADL's:  Intact  Cognition: WNL  Sleep:  {BHH GOOD/FAIR/POOR:22877}   Screenings: PHQ2-9    Flowsheet Row Video Visit from [facility]/14/2021 in [facility] Video [facility]  PHQ-2 Total Score 6 2  PHQ-9 Total Score 18 16      Flowsheet Row ED from 10/18/2020 in [facility]  C-SSRS RISK CATEGORY No Risk        Assessment and Plan:  Joseph Simon is a 35 y.o. year old male with a history ofdepression, marijuana use , who presents for follow up appointment for  below.    1. Current moderate episode of major depressive disorder without prior episode [facility]) He continues to report depressive symptoms since the last visit.  Psychosocial stressors include financial strain, and managing 2 jobs.  He is now amenable to try bupropion to target depression and inattention.  Discussed potential risk of headache.  He has no known history of seizure.    2. Inattention Unchanged and he continues to struggle with inattention. He reports history of ADHD since 35-year-old.  Etiology of inattention is multifactorial given his mood symptoms and insomnia.  Noted that he used to use marijuana as well.  Referral was made for evaluation of ADHD.  He agrees to contact the clinic to make an appointment.    # Marijuana use He has been abstinent from marijuana use since the last visit, although he used to use it daily.  Will continue motivational interview.    # Insomnia He reports snoring, significant daytime fatigue and insomnia.  Referral was made for evaluation for sleep apnea, and he no showed.    Plan Start bupropion 150 mg daily  Referred to neuropsychological testing  Obtain record from his previous PCP (seen six months ago) Next appointment- 11/22 at 10 AM, video Obtain TSH if that is not done by his PCP   The patient demonstrates the following risk factors for suicide: Chronic risk factors for suicide include: psychiatric disorder of depression . Acute risk factors for suicide include: loss (financial, interpersonal, professional). Protective factors for this patient include: positive social support. Considering these factors, the overall suicide risk at this point appears to be low. Patient is appropriate for outpatient follow up.   Neysa Hotter, MD 03/25/2021, 4:54 AM

## 2021-03-28 ENCOUNTER — Telehealth: Payer: 59 | Admitting: Psychiatry

## 2021-03-28 ENCOUNTER — Telehealth: Payer: Self-pay | Admitting: Psychiatry

## 2021-03-28 ENCOUNTER — Other Ambulatory Visit: Payer: Self-pay

## 2021-03-28 NOTE — Progress Notes (Deleted)
BH MD/PA/NP OP Progress Note  03/28/2021 3:11 PM ANCHOR DWAN  MRN:  [id number]  Chief Complaint:  HPI: *** Visit Diagnosis: No diagnosis found.  Past Psychiatric History: Please see initial evaluation for full details. I have reviewed the history. No updates at this time.     Past Medical History:  Past Medical History:  Diagnosis Date   Back pain    No past surgical history on file.  Family Psychiatric History: Please see initial evaluation for full details. I have reviewed the history. No updates at this time.     Family History:  Family History  Problem Relation Age of Onset   ADD / ADHD Father    Hypertension Father    Alcohol abuse Maternal Grandfather    Diabetes Maternal Grandfather    Diabetes Maternal Grandmother    Heart attack Paternal Grandfather    Stroke Paternal Grandfather     Social History:  Social History   Socioeconomic History   Marital status: Single    Spouse name: Not on file   Number of children: Not on file   Years of education: Not on file   Highest education level: Not on file  Occupational History   Not on file  Tobacco Use   Smoking status: Every Day    Packs/day: 1.00    Types: Cigarettes   Smokeless tobacco: Never  Vaping Use   Vaping Use: Never used  Substance and Sexual Activity   Alcohol use: Yes    Comment: occ   Drug use: No   Sexual activity: Not on file  Other Topics Concern   Not on file  Social History Narrative   Not on file   Social Determinants of Health   Financial Resource Strain: Not on file  Food Insecurity: Not on file  Transportation Needs: Not on file  Physical Activity: Not on file  Stress: Not on file  Social Connections: Not on file    Allergies: No Known Allergies  Metabolic Disorder Labs: No results found for: HGBA1C, MPG No results found for: PROLACTIN No results found for: CHOL, TRIG, HDL, CHOLHDL, VLDL, LDLCALC No results found for: TSH  Therapeutic Level Labs: No results  found for: LITHIUM No results found for: VALPROATE No components found for:  CBMZ  Current Medications: Current Outpatient Medications  Medication Sig Dispense Refill   buPROPion (WELLBUTRIN XL) 150 MG 24 hr tablet Take 1 tablet (150 mg total) by mouth daily. 30 tablet 1   No current facility-administered medications for this visit.     Musculoskeletal: Strength & Muscle Tone:  N/A Gait & Station:  N/A Patient leans: N/A  Psychiatric Specialty Exam: Review of Systems  There were no vitals taken for this visit.There is no height or weight on file to calculate BMI.  General Appearance: {Appearance:22683}  Eye Contact:  {BHH EYE CONTACT:22684}  Speech:  Clear and Coherent  Volume:  Normal  Mood:  {BHH MOOD:22306}  Affect:  {Affect (PAA):22687}  Thought Process:  Coherent  Orientation:  Full (Time, Place, and Person)  Thought Content: Logical   Suicidal Thoughts:  {ST/HT (PAA):22692}  Homicidal Thoughts:  {ST/HT (PAA):22692}  Memory:  Immediate;   Good  Judgement:  {Judgement (PAA):22694}  Insight:  {Insight (PAA):22695}  Psychomotor Activity:  Normal  Concentration:  Concentration: Good and Attention Span: Good  Recall:  Good  Fund of Knowledge: Good  Language: Good  Akathisia:  No  Handed:  Right  AIMS (if indicated): not done  Assets:  Communication  Skills Desire for Improvement  ADL's:  Intact  Cognition: WNL  Sleep:  {BHH GOOD/FAIR/POOR:22877}   Screenings: PHQ2-9    Flowsheet Row Video Visit from [facility]/14/2021 in [facility] Video Visit from 12/24/2020 [facility]  PHQ-2 Total Score 6 2  PHQ-9 Total Score 18 16      Flowsheet Row ED from 10/18/2020 in [facility]  C-SSRS RISK CATEGORY No Risk        Assessment and Plan:  Joseph Simon is a 35 y.o. year old male with a history of  depression, marijuana use, who presents for follow up appointment for  below.     1. Current moderate episode of major depressive disorder without prior episode [facility]) He continues to report depressive symptoms since the last visit.  Psychosocial stressors include financial strain, and managing 2 jobs.  He is now amenable to try bupropion to target depression and inattention.  Discussed potential risk of headache.  He has no known history of seizure.    2. Inattention Unchanged and he continues to struggle with inattention. He reports history of ADHD since 32-year-old.  Etiology of inattention is multifactorial given his mood symptoms and insomnia.  Noted that he used to use marijuana as well.  Referral was made for evaluation of ADHD.  He agrees to contact the clinic to make an appointment.    # Marijuana use He has been abstinent from marijuana use since the last visit, although he used to use it daily.  Will continue motivational interview.    # Insomnia He reports snoring, significant daytime fatigue and insomnia.  Referral was made for evaluation for sleep apnea, and he no showed.    Plan Start bupropion 150 mg daily  Referred to neuropsychological testing  Obtain record from his previous PCP (seen six months ago) Next appointment- 11/22 at 10 AM, video Obtain TSH if that is not done by his PCP   The patient demonstrates the following risk factors for suicide: Chronic risk factors for suicide include: psychiatric disorder of depression . Acute risk factors for suicide include: loss (financial, interpersonal, professional). Protective factors for this patient include: positive social support. Considering these factors, the overall suicide risk at this point appears to be low. Patient is appropriate for outpatient follow up.     Neysa Hotter, MD 03/28/2021, 3:11 PM

## 2021-03-28 NOTE — Telephone Encounter (Signed)
Sent link for video visit through Epic. Patient did not sign in. Called the patient for appointment scheduled today. The patient answered the phone, and he will be able to do a visit after one hour. He verbalized understanding that this would be considered as no show. He agrees to reschedule tomorrow at 2 PM for video visit.

## 2021-03-29 ENCOUNTER — Other Ambulatory Visit: Payer: Self-pay

## 2021-03-29 ENCOUNTER — Telehealth: Payer: Self-pay | Admitting: Psychiatry

## 2021-03-29 ENCOUNTER — Telehealth: Payer: 59 | Admitting: Psychiatry

## 2021-03-29 NOTE — Telephone Encounter (Signed)
Sent link for video visit through Epic. Patient did not sign in. Called the patient for appointment scheduled today. He states that he is driving now, and asks if this clinician could wait for him to be back home. It would take 30 mins. He is unable to pull over as he is driving on a highway. He was informed that this will be counted as no-show especially given we talked to set this appointment yesterday.  He asks what would happen with no show.  This clinician informed him that he may be charged for this.  He states that "all right then. I will give your office a call back if I need an appointment again. "  He hang up the phone.

## 2022-10-17 ENCOUNTER — Encounter (HOSPITAL_COMMUNITY): Payer: Self-pay | Admitting: Registered Nurse

## 2022-10-17 ENCOUNTER — Ambulatory Visit (HOSPITAL_COMMUNITY)
Admission: EM | Admit: 2022-10-17 | Discharge: 2022-10-17 | Disposition: A | Discharge status: Home / Self Care | Payer: 59 | Attending: Registered Nurse | Admitting: Registered Nurse

## 2022-10-17 DIAGNOSIS — F4323 Adjustment disorder with mixed anxiety and depressed mood: Secondary | ICD-10-CM | POA: Diagnosis not present

## 2022-10-17 NOTE — Discharge Instructions (Addendum)
  [facility]: Outpatient psychiatric Services:   Please see the walk in hours listed below.  Medication Management New Patient needing Medication Management Walk-in, and Existing Patients needing to see a provider for management coming as a walk in   Monday thru Friday 8:00 AM first come first serve until slots are full.  Recommend being there by 7:15 AM to ensure a slot is open.  Therapy New Patient Therapy Intake and Existing Patients needing to see therapist coming in as a walk in.   Monday, Wednesday, and Thursday morning at 8:00 am first come first serve.  Recommend being there by 7:15 AM to ensure a slot is open.    Every 1st, 2nd, and 3rd Friday at 1:00 PM first come first serve until slots are full.  Will still need to come in that morning at 7:15 AM to get registered for an afternoon slot.  For all walk-ins we ask that you arrive by 7:15 am because patients will be seen in there order of arrival (FIRST COME FIRST SERVE) Availability is limited, therefore you may not be seen on the same day that you walk in if all slots are full.    Our goal is to serve and meet the needs of our community to the best of our ability.    [facility]   [address] [place], [place] [address]   [phone]    M.E.N.D. meets ONLINE via ZOOM from 12pm-1pm on:  REGISTRATION REQUIRED:  [email or web address] February 7th  March 6th May 1st June 5th August 7th September 4th November 6th  MEND: Men's Psychologist, clinical for [employer] to [employer], the [employer] for [employer], a Psychologist, sport and exercise (RGN) initiative from the [employer].  MEND is a community where men come together to support, learn, and grow, emphasizing key aspects of personal and community development.  MEND's Mission: MEND is committed to fostering resilience, empowerment, awareness, and  community health among men. Through open dialogue and shared experiences, we strive to create a supportive space where men can navigate challenges, enhance their well-being, and contribute positively to the community.  Join [place]: MEND invites men from all walks of life to join our community. Whether you're a father, a professional, or someone navigating life's complexities, MEND is here for you. Together, we can strengthen the fabric of our community and empower each other to lead fulfilling lives. Explore the MEND community and take the first step towards a journey of personal growth and community impact. Topics of Conversation Fatherhood: Explore the joys and challenges of fatherhood, sharing insights and experiences. Mental Health: Address the importance of mental health, reduce stigma, and provide support. Substance Use: Foster understanding and promote dialogue around substance use and its impact. Healthcare: Discuss health-related topics, preventive care, and overall well-being. Navigating Community Systems: Share strategies for navigating community resources and systems effectively.  What We Offer: Regular Meetings: Engage in meaningful discussions on fatherhood, mental health, substance use, healthcare, and navigating community systems. Supportive Environment: Find a welcoming space where your voice is heard, and your journey is valued.

## 2022-10-17 NOTE — Progress Notes (Signed)
   10/17/22 1318  [facility] Triage Screening (Walk-ins at [facility] only)  How Did You Hear About [place]? Legal System Psychologist, educational)  What Is the Reason for Your Visit/Call Today? Joseph Simon is a 37 year old male presenting to [facility] voluntarily seeking a mental health evaluation recommended by his probation officer. Patient reports he has been under a lot of stress lately and he had a "nervous breakdown". Pt reports he called his TASK worker and PO  who came to visit him and recommended he come here for evaluation due to suicidal ideations without a plan or intent. Patient reports he felt "overwhelmed" due to having difficulty finding a place to live with his criminal record. Patient reports he set up therapy with [facility] and his appointment is on 11/09/22. Patient reports diagnosis of ADHD and anxiety and he use to take medications for the ADHD. Patient is on probation for 18 months due to a cocaine possession charge. 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 Patient denies SI, HI, AVH and reports THC use daily and ETOH use about three times a week.  Patient reports he does not want to harm himself and gives protective factors of his family. Patient does not have access to a firearm.  How Long Has This Been Causing You Problems? > than 6 months  Have You Recently Had Any Thoughts About Hurting Yourself? Yes  How long ago did you have thoughts about hurting yourself? yesterday  Are You Planning to Commit Suicide/Harm Yourself At This time? No  Have you Recently Had Thoughts About Hurting Someone Karolee Ohs? No  Are You Planning To Harm Someone At This Time? No  Explanation: NA  Are you currently experiencing any auditory, visual or other hallucinations? No  Have You Used Any Alcohol or Drugs in the Past 24 Hours? Yes  How long ago did you use Drugs or Alcohol? LAST NIGHT  What Did You Use and How Much? THC YESTERDAY  Do you have any current medical co-morbidities that require immediate attention? No  Clinician  description of patient physical appearance/behavior: anxious  What Do You Feel Would Help You the Most Today? Treatment for Depression or other mood problem  If access to [facility] was not available, would you have sought care in the Emergency Department? No  Determination of Need Routine (7 days)  Options For Referral Medication Management;Outpatient Therapy

## 2022-10-17 NOTE — ED Provider Notes (Signed)
[facility]al [facility] Medical Screening Exam  Patient Name: Joseph Simon MRN: [id number] Date of Evaluation: 10/17/22 Chief Complaint:   Diagnosis:  Final diagnoses:  Adjustment disorder with mixed anxiety and depressed mood    History of Present illness: Joseph Simon is a 37 y.o. male patient presented to [facility] as a walk in with complaints of needing psychiatric evaluation after being referred by his probation officer  Joseph Simon, 37 y.o., male patient seen face to face by this provider, consulted with Dr. Nelly Rout; and chart reviewed on 10/17/22.  On evaluation Joseph Simon reports he has been feeling overwhelmed since getting out of prison.  Patient states "It's not that I don't have a job.  I have a job working for [employer] that he gets frustrated when you are blocked from a lot of things because of your past or my prison sentence.  Like I cannot get a house my name are just certain things that makes Joseph Simon caring for his family.  Yesterday I was feeling really frustrated and overwhelmed and I told my probation officer I just wish I was dead but I did not mean that I want to go out and kill myself or blow my brains out or anything like that.  I do not even have again it was just a statement.  I tried to explain that to him but he wanted me to come get a psychiatric evaluation anyway."  Patient reports he has no psychiatric history other than ADHD.  Denies prior psychiatric hospitalization, suicide attempts, or self harming behaviors.  At this time patient denies suicidal/self-harm/homicidal ideations, psychosis, paranoia.  Patient reports he lives with his wife and 3 kids.  Reports family is supportive.  Reports he has scheduled an appointment with [facility] [place] to start therapy July 5.  Discussed other community resources for outpatient psychiatric services and informed information for [facility] behavioral health center would also be added  to his discharge instructions.  Patient states he needs a letter showing that he came in for psychiatric evaluation to give his probation officer. During evaluation Joseph Simon is sitting upright in chair, dressed appropriately for weather.  There is no noted distress.  He is alert/oriented x 4, calm, cooperative, attentive, and responses were relevant and appropriate to assessment questions.  He spoke in a clear tone at moderate volume, and normal pace, with good eye contact.   He denies suicidal/self-harm/homicidal ideation, psychosis, and paranoia.  Objectively:  there is no evidence of psychosis/mania or delusional thinking.  He conversed coherently, with goal directed thoughts, and no distractibility, or pre-occupation.  At this time Joseph Simon is educated and verbalizes understanding of mental health resources and other crisis services in the community. He is instructed to call 911 and present to the nearest emergency room should he experience any suicidal/homicidal ideation, auditory/visual/hallucinations, or detrimental worsening of his mental health condition.  Resources given.    Flowsheet Row ED from 10/17/2022 in [facility] ED from 10/18/2020 in [facility] Emergency Department at [facility]  C-SSRS RISK CATEGORY Low Risk No Risk       Psychiatric Specialty Exam  Presentation  General Appearance:Appropriate for Environment  Eye Contact:Good  Speech:Clear and Coherent; Normal Rate  Speech Volume:Normal  Handedness:Right   Mood and Affect  Mood: Anxious  Affect: Appropriate; Congruent   Thought Process  Thought Processes: Coherent; Goal Directed  Descriptions of Associations:Intact  Orientation:Full (Time,  Place and Person)  Thought Content:Logical    Hallucinations:None  Ideas of Reference:None  Suicidal Thoughts:No  Homicidal Thoughts:No   Sensorium  Memory: Immediate Good; Recent Good; Remote  Good  Judgment: Intact  Insight: Present   Executive Functions  Concentration: Good  Attention Span: Good  Recall: Good  Fund of Knowledge: Good  Language: Good   Psychomotor Activity  Psychomotor Activity: Normal   Assets  Assets: Communication Skills; Desire for Improvement; Housing; Leisure Time; Physical Health; Resilience; Social Support   Sleep  Sleep: Good  Number of hours: No data recorded  Physical Exam: Physical Exam Vitals and nursing note reviewed.  Constitutional:      General: He is not in acute distress.    Appearance: Normal appearance. He is not ill-appearing.  HENT:     Head: Normocephalic.  Eyes:     Conjunctiva/sclera: Conjunctivae normal.  Cardiovascular:     Rate and Rhythm: Normal rate.  Pulmonary:     Effort: Pulmonary effort is normal.  Musculoskeletal:        General: Normal range of motion.     Cervical back: Normal range of motion.  Skin:    General: Skin is warm and dry.  Neurological:     Mental Status: He is alert and oriented to person, place, and time.  Psychiatric:        Attention and Perception: Attention and perception normal. He does not perceive auditory or visual hallucinations.        Mood and Affect: Mood and affect normal.        Speech: Speech normal.        Behavior: Behavior is agitated.        Thought Content: Thought content normal. Thought content is not paranoid or delusional. Thought content does not include homicidal or suicidal ideation.        Cognition and Memory: Cognition normal.        Judgment: Judgment normal.    Review of Systems  Constitutional:        No other complaints voiced   Psychiatric/Behavioral:  Depression: Stable. Hallucinations: Denies. Substance abuse: Denies. Suicidal ideas: Denies. The patient does not have insomnia. Nervous/anxious: Stable.       Reports feeling overwhelmed yesterday making a passive suicidal statement in front of his probation officer.  All  other systems reviewed and are negative.  Blood pressure (!) 149/88, pulse 74, temperature 98.4 F (36.9 C), temperature source Oral, resp. rate 18, height 6\' 4"  (1.93 m), weight 210 lb (95.3 kg), SpO2 100 %. Body mass index is 25.56 kg/m.  Musculoskeletal: Strength & Muscle Tone: within normal limits Gait & Station: normal Patient leans: N/A   [facility] MSE Discharge Disposition for Follow up and Recommendations: Based on my evaluation the patient does not appear to have an emergency medical condition and can be discharged with resources and follow up care in outpatient services for Individual Therapy   Aariya Ferrick, NP 10/17/2022, 2:00 PM

## 2023-09-04 ENCOUNTER — Encounter (HOSPITAL_BASED_OUTPATIENT_CLINIC_OR_DEPARTMENT_OTHER): Payer: Self-pay | Admitting: Emergency Medicine

## 2023-09-04 ENCOUNTER — Emergency Department (HOSPITAL_BASED_OUTPATIENT_CLINIC_OR_DEPARTMENT_OTHER): Payer: Self-pay

## 2023-09-04 ENCOUNTER — Emergency Department (HOSPITAL_BASED_OUTPATIENT_CLINIC_OR_DEPARTMENT_OTHER)
Admission: EM | Admit: 2023-09-04 | Discharge: 2023-09-04 | Disposition: A | Discharge status: Home / Self Care | Payer: Self-pay | Attending: Emergency Medicine | Admitting: Emergency Medicine

## 2023-09-04 ENCOUNTER — Other Ambulatory Visit: Payer: Self-pay

## 2023-09-04 DIAGNOSIS — F17210 Nicotine dependence, cigarettes, uncomplicated: Secondary | ICD-10-CM | POA: Insufficient documentation

## 2023-09-04 DIAGNOSIS — D72829 Elevated white blood cell count, unspecified: Secondary | ICD-10-CM | POA: Insufficient documentation

## 2023-09-04 DIAGNOSIS — M792 Neuralgia and neuritis, unspecified: Secondary | ICD-10-CM | POA: Insufficient documentation

## 2023-09-04 LAB — BASIC METABOLIC PANEL WITH GFR
Anion gap: 9 (ref 5–15)
BUN: 10 mg/dL (ref 6–20)
CO2: 25 mmol/L (ref 22–32)
Calcium: 9.1 mg/dL (ref 8.9–10.3)
Chloride: 103 mmol/L (ref 98–111)
Creatinine, Ser: 1.03 mg/dL (ref 0.61–1.24)
GFR, Estimated: >60 mL/min (ref >60)
Glucose, Bld: 91 mg/dL (ref 70–99)
Potassium: 3.7 mmol/L (ref 3.5–5.1)
Sodium: 137 mmol/L (ref 135–145)

## 2023-09-04 LAB — CBC
HCT: 42.9 % (ref 39.0–52.0)
Hemoglobin: 15.1 g/dL (ref 13.0–17.0)
MCH: 31.8 pg (ref 26.0–34.0)
MCHC: 35.2 g/dL (ref 30.0–36.0)
MCV: 90.3 fL (ref 80.0–100.0)
Platelets: 288 10*3/uL (ref 150–400)
RBC: 4.75 MIL/uL (ref 4.22–5.81)
RDW: 13.4 % (ref 11.5–15.5)
WBC: 10.9 10*3/uL — ABNORMAL HIGH (ref 4.0–10.5)
nRBC: 0 % (ref 0.0–0.2)

## 2023-09-04 LAB — TROPONIN T, HIGH SENSITIVITY: Troponin T High Sensitivity: <15 ng/L (ref <19)

## 2023-09-04 MED ORDER — KETOROLAC TROMETHAMINE 30 MG/ML IJ SOLN
30.0000 mg | Freq: Once | INTRAMUSCULAR | Status: DC
Start: 2023-09-04 — End: 2023-09-04

## 2023-09-04 MED ORDER — KETOROLAC TROMETHAMINE 15 MG/ML IJ SOLN
15.0000 mg | Freq: Once | INTRAMUSCULAR | Status: AC
  Administered 2023-09-04: 15 mg via INTRAVENOUS
  Filled 2023-09-04: qty 1

## 2023-09-04 MED ORDER — PREDNISONE 10 MG (21) PO TBPK: ORAL_TABLET | Freq: Every day | ORAL | 0 refills | Status: AC

## 2023-09-04 MED ORDER — CYCLOBENZAPRINE HCL 10 MG PO TABS: 10.0000 mg | ORAL_TABLET | Freq: Two times a day (BID) | ORAL | 0 refills | Status: AC | PRN

## 2023-09-04 NOTE — Discharge Instructions (Signed)
 As discussed, suspect that your symptoms are coming from your neck.  CT did show evidence of bulging disc.  Will place you on steroids for treatment of your symptoms.  Recommend follow-up with neurosurgery in the outpatient setting.  Will attach information also the referral.  Please do not hesitate to return to the emergency department if the worrisome signs and symptoms we discussed become apparent.

## 2023-09-04 NOTE — ED Triage Notes (Signed)
 Right hand paresthesia x5 days. Pain in elbow and neck. Concerned for pinched nerve. 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 Denies CP SOB. Grip of right hand slightly diminished compared to left. No other deficits.

## 2023-09-04 NOTE — ED Provider Notes (Signed)
 [place] EMERGENCY DEPARTMENT AT [facility] Provider Note   CSN: [id number] Arrival date & time: 09/04/23  1609     History  Chief Complaint  Patient presents with   Right arm pain    Joseph Simon is a 38 y.o. Simon.  HPI   38 year old Simon presents emergency department with complaints of right arm pain, tingling sensation in digits on right hand.  Patient reports symptoms present for the past 5 days.  States that he woke up in the morning and felt like he had a "crick in my neck."  States that he went to "pop" his neck and noted some radiating pain down his right arm to his fingertips.  States that symptoms were initially improving with Aleve/ibuprofen .  States he has since developed tingling sensation in his thumb, index, middle finger on his right hand as well as intermittent decreased strength.  Denies any chest pain, shortness of breath, fever, chills, history of IV drug use, prolonged corticosteroid use, no malignancy.  No significant pertinent past medical history.  Home Medications Prior to Admission medications   Medication Sig Start Date End Date Taking? Authorizing Provider  cyclobenzaprine  (FLEXERIL ) 10 MG tablet Take 1 tablet (10 mg total) by mouth 2 (two) times daily as needed for muscle spasms. 09/04/23  Yes Neil Balls A, PA  predniSONE  (STERAPRED UNI-PAK 21 TAB) 10 MG (21) TBPK tablet Take by mouth daily. Take 6 tabs by mouth daily  for 2 days, then 5 tabs for 2 days, then 4 tabs for 2 days, then 3 tabs for 2 days, 2 tabs for 2 days, then 1 tab by mouth daily for 2 days 09/04/23  Yes Neil Balls A, PA  buPROPion  (WELLBUTRIN  XL) 150 MG 24 hr tablet Take 1 tablet (150 mg total) by mouth daily. 02/14/21 04/15/21  Todd Fossa, MD      Allergies    Patient has no known allergies.    Review of Systems   Review of Systems  All other systems reviewed and are negative.   Physical Exam Updated Vital Signs BP (!) 158/114   Pulse 71   Temp 98 F  (36.7 C) (Oral)   Resp 17   Ht 6\' 3"  (1.905 m)   Wt 102.1 kg   SpO2 100%   BMI 28.12 kg/m  Physical Exam Vitals and nursing note reviewed.  Constitutional:      General: He is not in acute distress.    Appearance: He is well-developed.  HENT:     Head: Normocephalic and atraumatic.  Eyes:     Conjunctiva/sclera: Conjunctivae normal.  Cardiovascular:     Rate and Rhythm: Normal rate and regular rhythm.     Heart sounds: No murmur heard. Pulmonary:     Effort: Pulmonary effort is normal. No respiratory distress.     Breath sounds: Normal breath sounds.  Abdominal:     Palpations: Abdomen is soft.     Tenderness: There is no abdominal tenderness.  Musculoskeletal:        General: No swelling.     Cervical back: Neck supple.     Comments: No midline tenderness cervical spine.  Paraspinal tenderness in the right cervical region.  Full range of motion bilateral shoulders, elbows, wrist, digits.  Patient reporting subjective decrease sensation along palmar aspect of digits 1 through 3 of right hand.  Initially, slightly decreased strength in flexion 1 through 3 digits of right hand.  Radial pulses 2+ bilaterally.  No upper extremity edema.  No overlying erythema, palpable fluctuance/induration.  Skin:    General: Skin is warm and dry.     Capillary Refill: Capillary refill takes less than 2 seconds.  Neurological:     Mental Status: He is alert.  Psychiatric:        Mood and Affect: Mood normal.     ED Results / Procedures / Treatments   Labs (all labs ordered are listed, but only abnormal results are displayed) Labs Reviewed  CBC - Abnormal; Notable for the following components:      Result Value   WBC 10.9 (*)    All other components within normal limits  BASIC METABOLIC PANEL WITH GFR  TROPONIN T, HIGH SENSITIVITY    EKG None  Radiology CT Cervical Spine Wo Contrast Result Date: 09/04/2023 CLINICAL DATA:  Cervical radiculopathy. Right hand paresthesia. Pain in  elbow and neck. EXAM: CT CERVICAL SPINE WITHOUT CONTRAST TECHNIQUE: Multidetector CT imaging of the cervical spine was performed without intravenous contrast. Multiplanar CT image reconstructions were also generated. RADIATION DOSE REDUCTION: This exam was performed according to the departmental dose-optimization program which includes automated exposure control, adjustment of the mA and/or kV according to patient size and/or use of iterative reconstruction technique. COMPARISON:  Cervical spine radiographs 06/19/2016 FINDINGS: Alignment: No evidence traumatic listhesis. Loss of lordosis is likely chronic or positional. Skull base and vertebrae: No acute fracture. Soft tissues and spinal canal: No prevertebral fluid or swelling. No visible canal hematoma. Disc levels: Mild-moderate spondylosis and disc space height loss at C5-C6 and C6-C7. Eccentric right posterior disc osteophyte complex at C5-C6 causes mild effacement of the right ventral thecal sac. No severe spinal canal narrowing. Uncovertebral spurring causes advanced left greater than right neural foraminal narrowing at C5-C6. Upper chest: No acute abnormality. Other: None. IMPRESSION: 1. No acute fracture. 2. Mild to moderate spondylosis and disc space height loss at C5-C6 and C6-C7. 3. Eccentric right posterior disc osteophyte complex at C5-C6 causes mild effacement of the right ventral thecal sac. 4. Uncovertebral spurring causes advanced left greater than right neural foraminal narrowing at C5-C6. Electronically Signed   By: Rozell Cornet M.D.   On: 09/04/2023 17:15    Procedures Procedures    Medications Ordered in ED Medications  ketorolac  (TORADOL ) 15 MG/ML injection 15 mg (15 mg Intravenous Given 09/04/23 1711)    ED Course/ Medical Decision Making/ A&P Clinical Course as of 09/04/23 1821  Wed Sep 04, 2023  1755 Reevaluation of the patient showed improvement of symptoms including pain, described weakness of the hand.  Repeat grip  assessment showed symmetric 5 out of 5 grip strength; query if initial assessment was secondary to effort or pain.  Will discharge and have patient follow-up with neurosurgery in the outpatient setting. [CR]    Clinical Course User Index [CR]  Butter, PA                                 Medical Decision Making Amount and/or Complexity of Data Reviewed Labs: ordered. Radiology: ordered.  Risk Prescription drug management.   This patient presents to the ED for concern of arm pain, this involves an extensive number of treatment options, and is a complaint that carries with it a high risk of complications and morbidity.  The differential diagnosis includes ACS, ischemic limb, cervical radiculopathy, DVT, cellulitis, , necrotizing infection, CVA, other   Co morbidities that complicate the patient evaluation  See HPI  Additional history obtained:  Additional history obtained from EMR External records from outside source obtained and reviewed including hospital records   Lab Tests:  I Ordered, and personally interpreted labs.  The pertinent results include: Leukocytosis of 10.9.  No evidence of anemia.  Platelets within range.  Neurology abnormalities.  No renal dysfunction.  Troponin less than 15   Imaging Studies ordered:  I ordered imaging studies including CT cervical spine I independently visualized and interpreted imaging which showed no acute fracture.  Mild to moderate spondylosis and disc space height loss C5-C6 and C6-C7.  Eccentric right posterior disc osteophyte complex at C5-C6 causing mild effacement of right ventral thecal sac.  Uncovertebral spurring causing advanced left greater than right neuroforaminal narrowing C5-C6. I agree with the radiologist interpretation  Cardiac Monitoring: / EKG:  The patient was maintained on a cardiac monitor.  I personally viewed and interpreted the cardiac monitored which showed an underlying rhythm of: Sinus rhythm.   Borderline PR of 205.  ST elevation anterior lateral leads consistent with benign early repull.   Consultations Obtained:  N/a   Problem List / ED Course / Critical interventions / Medication management  Cervical radiculopathy I ordered medication including Toradol    Reevaluation of the patient after these medicines showed that the patient improved I have reviewed the patients home medicines and have made adjustments as needed   Social Determinants of Health:  Chronic cigarette use.  Denies illicit drug use.   Test / Admission - Considered:  Cervical radiculopathy Vitals signs significant for hypertension blood pressure of 165/111. Otherwise within normal range and stable throughout visit. Laboratory/imaging studies significant for: See above 38 year old Simon presents emergency department with complaints of right arm pain, tingling sensation in digits on right hand.  Patient reports symptoms present for the past 5 days.  States that he woke up in the morning and felt like he had a "crick in my neck."  States that he went to "pop" his neck and noted some radiating pain down his right arm to his fingertips.  States that symptoms were initially improving with Aleve/ibuprofen .  States he has since developed tingling sensation in his thumb, index, middle finger on his right hand as well as intermittent decreased strength.  Denies any chest pain, shortness of breath, fever, chills, history of IV drug use, prolonged corticosteroid use, no malignancy. On exam, tenderness of the patient right paraspinal in the cervical region.  Patient describing pain radiating from right neck down to right wrist/forearm.  Reports decree sensation in digits 1 through 3 palmarly on right hand with slight weakness with grip of affected digits when compared to left initially.  Labs overall reassuring.  Patient with negative troponin, EKG consistent with benign repolarization pattern; low suspicion for ACS.  No pulse  deficits such as ST ischemic limb.  No overlying skin change concerning for secondary infectious process.  No extremity swelling concerning for DVT.  CT imaging of patient cervical spine concerning for spondylosis, right posterior disc osteophyte C5-C6 with mild effacement of right ventral thecal sac which could be causing patient's symptoms.  Treated with Toradol  and did note improvement of pain as well as improvement of strength clinically; query whether or not weakness was truly present as muscular strength upon reassessment 5 out of 5 bilaterally with grip with Toradol ; pain could have been limiting factor.  Will trial prednisone  taper recommend follow-up with neurosurgery for concern for cervical radiculopathy.  Treatment plan discussed with patient and he acknowledged understanding was agreeable  to said plan.  Patient overall well-appearing, afebrile in no acute distress. Worrisome signs and symptoms were discussed with the patient, and the patient acknowledged understanding to return to the ED if noticed. Patient was stable upon discharge.          Final Clinical Impression(s) / ED Diagnoses Final diagnoses:  Radicular pain in right arm    Rx / DC Orders ED Discharge Orders     None         Paulding Butter, [place] 09/04/23 1821    Tegeler, Marine Sia, MD 09/04/23 1925
